# Patient Record
Sex: Female | Born: 1950 | Race: White | Hispanic: No | Smoking: Never smoker
Health system: Southern US, Community
[De-identification: ages and names within clinical notes are randomized; demographics above are authoritative.]

## PROBLEM LIST (undated history)

## (undated) DIAGNOSIS — Z8719 Personal history of other diseases of the digestive system: Secondary | ICD-10-CM

## (undated) DIAGNOSIS — Z8619 Personal history of other infectious and parasitic diseases: Secondary | ICD-10-CM

## (undated) DIAGNOSIS — K5190 Ulcerative colitis, unspecified, without complications: Secondary | ICD-10-CM

## (undated) DIAGNOSIS — K5790 Diverticulosis of intestine, part unspecified, without perforation or abscess without bleeding: Secondary | ICD-10-CM

## (undated) DIAGNOSIS — M1990 Unspecified osteoarthritis, unspecified site: Secondary | ICD-10-CM

## (undated) DIAGNOSIS — D369 Benign neoplasm, unspecified site: Secondary | ICD-10-CM

## (undated) HISTORY — DX: Personal history of other diseases of the digestive system: Z87.19

## (undated) HISTORY — PX: ESOPHAGOGASTRODUODENOSCOPY: SHX1529

## (undated) HISTORY — PX: BREAST EXCISIONAL BIOPSY: SUR124

## (undated) HISTORY — DX: Personal history of other infectious and parasitic diseases: Z86.19

## (undated) HISTORY — DX: Unspecified osteoarthritis, unspecified site: M19.90

## (undated) HISTORY — DX: Diverticulosis of intestine, part unspecified, without perforation or abscess without bleeding: K57.90

## (undated) HISTORY — DX: Ulcerative colitis, unspecified, without complications: K51.90

## (undated) HISTORY — DX: Benign neoplasm, unspecified site: D36.9

## (undated) HISTORY — PX: COLONOSCOPY: SHX174

---

## 1989-11-21 HISTORY — PX: CHOLECYSTECTOMY: SHX55

## 2013-11-21 DIAGNOSIS — D369 Benign neoplasm, unspecified site: Secondary | ICD-10-CM

## 2013-11-21 HISTORY — PX: BREAST BIOPSY: SHX20

## 2013-11-21 HISTORY — DX: Benign neoplasm, unspecified site: D36.9

## 2015-03-12 LAB — HEPATIC FUNCTION PANEL
ALT: 22 (ref 7–35)
AST: 23 (ref 13–35)
Alkaline Phosphatase: 59 (ref 25–125)
Bilirubin, Total: 1.1

## 2015-03-12 LAB — BASIC METABOLIC PANEL
BUN: 13 (ref 4–21)
Creatinine: 0.7 (ref 0.5–1.1)
Glucose: 86
Sodium: 143 (ref 137–147)

## 2015-03-12 LAB — CBC AND DIFFERENTIAL
HCT: 41 (ref 36–46)
Hemoglobin: 13.1 (ref 12.0–16.0)
Neutrophils Absolute: 2678
Platelets: 262 (ref 150–399)
WBC: 5.1

## 2015-03-12 LAB — VITAMIN D 25 HYDROXY (VIT D DEFICIENCY, FRACTURES): Vit D, 25-Hydroxy: 53

## 2015-03-12 LAB — LIPID PANEL
Cholesterol: 205 — AB (ref 0–200)
HDL: 65 (ref 35–70)
LDL Cholesterol: 124
LDl/HDL Ratio: 3.2
Triglycerides: 81 (ref 40–160)

## 2015-03-12 LAB — TSH: TSH: 0.99 (ref 0.41–5.90)

## 2015-03-12 LAB — HEMOGLOBIN A1C: Hemoglobin A1C: 5.4

## 2017-12-27 LAB — LIPID PANEL
CHOLESTEROL: 169 (ref 0–200)
HDL: 50 (ref 35–70)
LDL Cholesterol: 102
LDl/HDL Ratio: 3.4
Triglycerides: 86 (ref 40–160)

## 2017-12-27 LAB — BASIC METABOLIC PANEL: GLUCOSE: 109

## 2019-01-31 ENCOUNTER — Encounter: Payer: Self-pay | Admitting: Family Medicine

## 2019-01-31 ENCOUNTER — Encounter

## 2019-01-31 ENCOUNTER — Other Ambulatory Visit: Payer: Self-pay

## 2019-01-31 ENCOUNTER — Ambulatory Visit (INDEPENDENT_AMBULATORY_CARE_PROVIDER_SITE_OTHER): Payer: Medicare Other | Admitting: Family Medicine

## 2019-01-31 VITALS — BP 134/76 | HR 78 | Temp 98.1°F | Ht 63.5 in | Wt 162.2 lb

## 2019-01-31 DIAGNOSIS — Z1239 Encounter for other screening for malignant neoplasm of breast: Secondary | ICD-10-CM | POA: Diagnosis not present

## 2019-01-31 DIAGNOSIS — F419 Anxiety disorder, unspecified: Secondary | ICD-10-CM | POA: Diagnosis not present

## 2019-01-31 DIAGNOSIS — F329 Major depressive disorder, single episode, unspecified: Secondary | ICD-10-CM | POA: Diagnosis not present

## 2019-01-31 DIAGNOSIS — F32A Depression, unspecified: Secondary | ICD-10-CM

## 2019-01-31 DIAGNOSIS — Z86010 Personal history of colonic polyps: Secondary | ICD-10-CM | POA: Diagnosis not present

## 2019-01-31 NOTE — Progress Notes (Signed)
Subjective:   Patient ID: Crystal Blair, female    DOB: [birth year], 68 y.o.   MRN: [id number]  Crystal Blair is a pleasant 68 y.o. year old female who presents to clinic today with New Patient (Initial Visit) (Patient is here today to establish care.  She ae grees to get the flu shot.  She will schedule a CPE with PAP.  She will sign for her records.  She does C/O tension H/A in the back of her head.  Not currently on Rx's but takes OTC meds.  She is not currently fasting.  She agrees to go to [facility] for Mammogram if ordered.  She is requesting a therapist.  She will schedule with Dr. Raeford Razor for her left knee pain.  Is needing a referral for Colonoscopy has H/O tubular adenoma 30-years-ago.)  on 01/31/2019  HPI:  Here to establish care.   Of note, computer system was down while patient was here so we agreed to have her return for CPX/Pap but did go over all of her concerns and medical history during the OV today.  Moved her from [place] (via [place]) last year.  Anxiety/depression- chronic issue.  She worries about her adult children a lot.  Overall, feels happy but has always had a therapist to discuss her "worries" and would like to see one here.  She also has a history of tubular adenoma and is asking for a referral for a colonoscopy.  She is not currently taking an prescription  Medications.  She is due for CPX/PAP/mammogram as well.  She has had some knee pain in left knee.   No flowsheet data found.   No flowsheet data found.    No current outpatient medications on file prior to visit.   No current facility-administered medications on file prior to visit.     No Known Allergies  Past Medical History:  Diagnosis Date  . Arthritis   . History of chicken pox      No family history on file.  Social History   Socioeconomic History  . Marital status: Widowed    Spouse name: Not on file  . Number of children: Not on file  . Years of education: Not on file   . Highest education level: Not on file  Occupational History  . Not on file  Social Needs  . Financial resource strain: Not on file  . Food insecurity:    Worry: Not on file    Inability: Not on file  . Transportation needs:    Medical: Not on file    Non-medical: Not on file  Tobacco Use  . Smoking status: Never Smoker  . Smokeless tobacco: Never Used  Substance and Sexual Activity  . Alcohol use: Yes    Comment: ccas  . Drug use: Never  . Sexual activity: Yes  Lifestyle  . Physical activity:    Days per week: Not on file    Minutes per session: Not on file  . Stress: Not on file  Relationships  . Social connections:    Talks on phone: Not on file    Gets together: Not on file    Attends religious service: Not on file    Active member of club or organization: Not on file    Attends meetings of clubs or organizations: Not on file    Relationship status: Not on file  . Intimate partner violence:    Fear of current or ex partner: Not on file  Emotionally abused: Not on file    Physically abused: Not on file    Forced sexual activity: Not on file  Other Topics Concern  . Not on file  Social History Narrative  . Not on file   The PMH, PSH, Social History, Family History, Medications, and allergies have been reviewed in [facility], and have been updated if relevant.    Review of Systems  Constitutional: Negative.   HENT: Negative.   Respiratory: Negative.   Cardiovascular: Negative.   Gastrointestinal: Negative.   Endocrine: Negative.   Genitourinary: Negative.   Musculoskeletal: Positive for arthralgias.  Skin: Negative.   Allergic/Immunologic: Negative.   Neurological: Negative.   Hematological: Negative.   Psychiatric/Behavioral: Positive for sleep disturbance. Negative for confusion, decreased concentration, dysphoric mood, hallucinations, self-injury and suicidal ideas. The patient is nervous/anxious. The patient is not hyperactive.   All other systems  reviewed and are negative.      Objective:    BP 134/76 (BP Location: Left Arm, Patient Position: Sitting, Cuff Size: Normal)   Pulse 78   Temp 98.1 F (36.7 C) (Oral)   Ht 5' 3.5" (1.613 m)   Wt 162 lb 3.2 oz (73.6 kg)   SpO2 98%   BMI 28.28 kg/m    Physical Exam    General:  Well-developed,well-nourished,in no acute distress; alert,appropriate and cooperative throughout examination Head:  normocephalic and atraumatic.   Eyes:  vision grossly intact, PERRL Ears:  R ear normal and L ear normal externally, TMs clear bilaterally Nose:  no external deformity.   Mouth:  good dentition.   Neck:  No deformities, masses, or tenderness noted. Lungs:  Normal respiratory effort, chest expands symmetrically. Lungs are clear to auscultation, no crackles or wheezes. Heart:  Normal rate and regular rhythm. S1 and S2 normal without gallop, murmur, click, rub or other extra sounds. Abdomen:  Bowel sounds positive,abdomen soft and non-tender without masses, organomegaly or hernias noted. Msk:  No deformity or scoliosis noted of thoracic or lumbar spine.   Extremities:  No clubbing, cyanosis, edema, or deformity noted with normal full range of motion of all joints.   Neurologic:  alert & oriented X3 and gait normal.   Skin:  Intact without suspicious lesions or rashes Cervical Nodes:  No lymphadenopathy noted Axillary Nodes:  No palpable lymphadenopathy Psych:  Cognition and judgment appear intact. Alert and cooperative with normal attention span and concentration. No apparent delusions, illusions, hallucinations      Assessment & Plan:   Screening for breast cancer - Plan: MM Digital Screening  History of adenomatous polyp of colon - Plan: Ambulatory referral to Gastroenterology  Anxiety and depression - Plan: Ambulatory referral to Psychology No follow-ups on file.

## 2019-01-31 NOTE — Progress Notes (Signed)
[facility]/thx dmf

## 2019-01-31 NOTE — Patient Instructions (Addendum)
1.) Please schedule an appointment with Dr. Raeford Razor for your left knee pain.  2.) Schedule a Physical with PAP and please be fasting that morning.  You may drink water and black coffee only that morning :)  3.) Please call [place] at [phone] both Burnetta Sabin and Atlee Abide are wonderful  4.) Please sign request for records from [facility] at [place], [place], [place] [address] - Dr. Chari Manning.

## 2019-01-31 NOTE — Progress Notes (Signed)
[facility]/thx dmf

## 2019-01-31 NOTE — Assessment & Plan Note (Signed)
>  20 minutes spent in face to face time with patient, >50% spent in counselling or coordination of care discussing medical history and concerns.  GAD 7 and PHq 9 screenings done today and were reassuring.  Referral placed for psychotherapy.  Will also refer to GI and place order for mammogram once computer system back up. She will return for labs, CPX, and pap smear. The patient indicates understanding of these issues and agrees with the plan.

## 2019-07-23 ENCOUNTER — Telehealth: Payer: Self-pay

## 2019-07-23 ENCOUNTER — Ambulatory Visit
Admission: RE | Admit: 2019-07-23 | Discharge: 2019-07-23 | Disposition: A | Discharge status: Home / Self Care | Payer: Medicare Other | Source: Ambulatory Visit | Attending: Family Medicine | Admitting: Family Medicine

## 2019-07-23 ENCOUNTER — Other Ambulatory Visit: Payer: Self-pay

## 2019-07-23 DIAGNOSIS — Z1239 Encounter for other screening for malignant neoplasm of breast: Secondary | ICD-10-CM

## 2019-07-23 DIAGNOSIS — Z1231 Encounter for screening mammogram for malignant neoplasm of breast: Secondary | ICD-10-CM | POA: Diagnosis not present

## 2019-07-23 NOTE — Telephone Encounter (Signed)
Copied from [place] [id number]. Topic: General - Inquiry >> Jul 23, 2019  2:19 PM Reyne Dumas L wrote: Reason for CRM:  Pt wants to know if she will have a PAP done at her CPE.

## 2019-07-24 NOTE — Telephone Encounter (Signed)
TA-does this patient need a PAP at her CPE? She is 13 has UHC/Medicare/plz advise/thx dmf

## 2019-07-25 NOTE — Telephone Encounter (Signed)
Yes if this is a welcome to medicare.

## 2019-07-26 NOTE — Telephone Encounter (Signed)
LMOVM that yes we will do a PAP and to please be fasting but drink plenty of water for 24 hours and when wakes drink water and/or black coffee only/thx dmf

## 2019-07-31 ENCOUNTER — Ambulatory Visit (INDEPENDENT_AMBULATORY_CARE_PROVIDER_SITE_OTHER): Payer: Medicare Other | Admitting: Behavioral Health

## 2019-07-31 ENCOUNTER — Other Ambulatory Visit: Payer: Self-pay

## 2019-07-31 ENCOUNTER — Encounter: Payer: Self-pay | Admitting: Family Medicine

## 2019-07-31 DIAGNOSIS — Z23 Encounter for immunization: Secondary | ICD-10-CM

## 2019-07-31 NOTE — Progress Notes (Signed)
Patient presents in office today for Influenza vaccination. IM injection was given in the left deltoid. Patient tolerated the injection well. No signs or symptoms of a reaction were noted prior to patient leaving the nurse visit. 

## 2019-08-01 ENCOUNTER — Ambulatory Visit: Payer: Medicare Other | Admitting: Family Medicine

## 2019-08-01 ENCOUNTER — Encounter: Payer: Medicare Other | Admitting: Family Medicine

## 2019-09-09 ENCOUNTER — Encounter: Payer: Self-pay | Admitting: Internal Medicine

## 2019-09-10 DIAGNOSIS — Z0000 Encounter for general adult medical examination without abnormal findings: Secondary | ICD-10-CM | POA: Insufficient documentation

## 2019-09-10 DIAGNOSIS — Z01419 Encounter for gynecological examination (general) (routine) without abnormal findings: Secondary | ICD-10-CM | POA: Insufficient documentation

## 2019-09-10 NOTE — Progress Notes (Signed)
Subjective:   Patient ID: Crystal Blair, female    DOB: 1951/08/16, 68 y.o.   MRN: [id number]  Crystal Blair is a pleasant 68 y.o. year old female who presents to clinic today with Annual Exam (would like pap today.  Pt will recieve Prevnar  injection today.  t would like another referral to GI,  pt also is fasting today.)  on 09/11/2019  HPI:  Health Maintenance  Topic Date Due  . Hepatitis C Screening  June 16, 1951  . HIV Screening  11/04/1968  . COLONOSCOPY  11/05/2003  . DEXA SCAN  11/04/2018  . TETANUS/TDAP  09/17/2019 (Originally 11/04/1972)  . PNA vac Low Risk Adult (2 of 2 - PPSV23) 09/10/2020  . MAMMOGRAM  07/22/2021  . PAP SMEAR-Modifier  09/10/2022  . INFLUENZA VACCINE  Completed   Due for pap smear.  No post menopausal bleeding.  Established care with me on 01/31/19- note reviewed.  She moved from [place] ([employer] last year.  She also has a history of tubular adenoma and I thereofre referred her for colonoscopy in 01/2019 but I do not see those results. I also referred her for a mammogram- done on 07/23/19- normal.  ?colonoscopy cancelled because of Covid?  She needs a new referral.  Has not had a dexa scan in years.   Anxiety/depression- chronic issue.  She worries about her adult children a lot.  Overall, feels happy but has always had a therapist to discuss her "worries" and would like to see one here. I therefore referred her to Dr. Gwenlyn Saran.  Depression screen [facility] 2/9 09/11/2019 01/31/2019  Decreased Interest 0 0  Down, Depressed, Hopeless 0 1  PHQ - 2 Score 0 1  Altered sleeping 0 1  Tired, decreased energy 0 1  Change in appetite 0 0  Feeling bad or failure about yourself  0 0  Trouble concentrating 0 0  Moving slowly or fidgety/restless 0 0  Suicidal thoughts 0 0  PHQ-9 Score 0 3  Difficult doing work/chores Not difficult at all Somewhat difficult   GAD 7 : Generalized Anxiety Score 09/11/2019 01/31/2019  Nervous, Anxious, on Edge 1 1   Control/stop worrying 0 2  Worry too much - different things 0 2  Trouble relaxing 0 1  Restless 0 0  Easily annoyed or irritable 0 1  Afraid - awful might happen 0 0  Total GAD 7 Score 1 7  Anxiety Difficulty Not difficult at all Somewhat difficult     Lab Results  Component Value Date   CHOL 169 12/27/2017   HDL 50 12/27/2017   LDLCALC 102 12/27/2017   TRIG 86 12/27/2017   Lab Results  Component Value Date   WBC 5.1 03/12/2015   HGB 13.1 03/12/2015   HCT 41 03/12/2015   PLT 262 03/12/2015   Lab Results  Component Value Date   TSH 0.99 03/12/2015   Current Outpatient Medications on File Prior to Visit  Medication Sig Dispense Refill  . Ascorbic Acid (VITAMIN C ADULT GUMMIES PO) Take by mouth.    Marland Kitchen BIOTIN PO Take 3,000 mcg by mouth daily.    . Calcium Carbonate (CALTRATE 600 PO) Take by mouth.    . Multiple Vitamin (MULTIVITAMIN) tablet Take 1 tablet by mouth daily.     No current facility-administered medications on file prior to visit.     No Known Allergies  Past Medical History:  Diagnosis Date  . Arthritis   . History of chicken pox   .  Tubular adenoma 2015   Per Colonoscopy/q34yrs    Past Surgical History:  Procedure Laterality Date  . BREAST BIOPSY Left 2015  . BREAST EXCISIONAL BIOPSY Left   . CHOLECYSTECTOMY  1991    Family History  Problem Relation Age of Onset  . Arthritis Mother   . COPD Mother   . Depression Mother   . Heart attack Mother   . Alcohol abuse Father   . Early death Father   . Hyperlipidemia Father   . Hypertension Father   . Cancer Sister   . Hypertension Sister     Social History   Socioeconomic History  . Marital status: Widowed    Spouse name: Not on file  . Number of children: Not on file  . Years of education: Not on file  . Highest education level: Not on file  Occupational History  . Not on file  Social Needs  . Financial resource strain: Not on file  . Food insecurity    Worry: Not on file     Inability: Not on file  . Transportation needs    Medical: Not on file    Non-medical: Not on file  Tobacco Use  . Smoking status: Never Smoker  . Smokeless tobacco: Never Used  Substance and Sexual Activity  . Alcohol use: Yes    Comment: ccas  . Drug use: Never  . Sexual activity: Yes  Lifestyle  . Physical activity    Days per week: Not on file    Minutes per session: Not on file  . Stress: Not on file  Relationships  . Social Herbalist on phone: Not on file    Gets together: Not on file    Attends religious service: Not on file    Active member of club or organization: Not on file    Attends meetings of clubs or organizations: Not on file    Relationship status: Not on file  . Intimate partner violence    Fear of current or ex partner: Not on file    Emotionally abused: Not on file    Physically abused: Not on file    Forced sexual activity: Not on file  Other Topics Concern  . Not on file  Social History Narrative  . Not on file   The PMH, PSH, Social History, Family History, Medications, and allergies have been reviewed in [facility], and have been updated if relevant.    Review of Systems  Constitutional: Negative.   HENT: Negative.   Eyes: Negative.   Respiratory: Negative.   Cardiovascular: Negative.   Endocrine: Negative.   Genitourinary: Negative.   Musculoskeletal: Negative.   Skin: Negative.   Allergic/Immunologic: Negative.   Neurological: Negative.   Hematological: Negative.   Psychiatric/Behavioral: Negative.   All other systems reviewed and are negative.      Objective:    BP 120/82 (BP Location: Left Arm, Patient Position: Sitting, Cuff Size: Normal)   Pulse 77   Temp 98.9 F (37.2 C) (Oral)   Ht 5\' 4"  (1.626 m)   Wt 159 lb 6.4 oz (72.3 kg)   SpO2 98%   BMI 27.36 kg/m  Wt Readings from Last 3 Encounters:  09/11/19 159 lb 6.4 oz (72.3 kg)  01/31/19 162 lb 3.2 oz (73.6 kg)     Physical Exam   General:   Well-developed,well-nourished,in no acute distress; alert,appropriate and cooperative throughout examination Head:  normocephalic and atraumatic.   Eyes:  vision grossly intact, PERRL Ears:  R ear normal and L ear normal externally, TMs clear bilaterally Nose:  no external deformity.   Mouth:  good dentition.   Neck:  No deformities, masses, or tenderness noted. Breasts:  No mass, nodules, thickening, tenderness, bulging, retraction, inflamation, nipple discharge or skin changes noted.   Lungs:  Normal respiratory effort, chest expands symmetrically. Lungs are clear to auscultation, no crackles or wheezes. Heart:  Normal rate and regular rhythm. S1 and S2 normal without gallop, murmur, click, rub or other extra sounds. Abdomen:  Bowel sounds positive,abdomen soft and non-tender without masses, organomegaly or hernias noted. Rectal:  no external abnormalities.   Genitalia:  Pelvic Exam:        External: normal female genitalia without lesions or masses        Vagina: normal without lesions or masses        Cervix: normal without lesions or masses        Adnexa: normal bimanual exam without masses or fullness        Uterus: normal by palpation        Pap smear: performed Msk:  No deformity or scoliosis noted of thoracic or lumbar spine.   Extremities:  No clubbing, cyanosis, edema, or deformity noted with normal full range of motion of all joints.   Neurologic:  alert & oriented X3 and gait normal.   Skin:  Intact without suspicious lesions or rashes Cervical Nodes:  No lymphadenopathy noted Axillary Nodes:  No palpable lymphadenopathy Psych:  Cognition and judgment appear intact. Alert and cooperative with normal attention span and concentration. No apparent delusions, illusions, hallucinations       Assessment & Plan:   Well woman exam with routine gynecological exam  History of adenomatous polyp of colon - Plan: CBC with Differential/Platelet  Anxiety and depression  Need for  hepatitis C screening test - Plan: Hepatitis C Antibody  Screening for HIV without presence of risk factors - Plan: HIV antibody (with reflex)  Disorder of bone and cartilage - Plan: Comprehensive metabolic panel, Vitamin D (25 hydroxy)  Hyperlipidemia, unspecified hyperlipidemia type - Plan: Comprehensive metabolic panel, TSH, Lipid panel No follow-ups on file.

## 2019-09-11 ENCOUNTER — Other Ambulatory Visit: Payer: Self-pay

## 2019-09-11 ENCOUNTER — Other Ambulatory Visit (HOSPITAL_COMMUNITY)
Admission: RE | Admit: 2019-09-11 | Discharge: 2019-09-11 | Disposition: A | Discharge status: Home / Self Care | Payer: Medicare Other | Source: Ambulatory Visit | Attending: Family Medicine | Admitting: Family Medicine

## 2019-09-11 ENCOUNTER — Ambulatory Visit (INDEPENDENT_AMBULATORY_CARE_PROVIDER_SITE_OTHER): Payer: Medicare Other | Admitting: Family Medicine

## 2019-09-11 ENCOUNTER — Encounter: Payer: Self-pay | Admitting: Family Medicine

## 2019-09-11 VITALS — BP 120/82 | HR 77 | Temp 98.9°F | Ht 64.0 in | Wt 159.4 lb

## 2019-09-11 DIAGNOSIS — M899 Disorder of bone, unspecified: Secondary | ICD-10-CM | POA: Diagnosis not present

## 2019-09-11 DIAGNOSIS — Z124 Encounter for screening for malignant neoplasm of cervix: Secondary | ICD-10-CM

## 2019-09-11 DIAGNOSIS — M949 Disorder of cartilage, unspecified: Secondary | ICD-10-CM | POA: Diagnosis not present

## 2019-09-11 DIAGNOSIS — Z01419 Encounter for gynecological examination (general) (routine) without abnormal findings: Secondary | ICD-10-CM

## 2019-09-11 DIAGNOSIS — E2839 Other primary ovarian failure: Secondary | ICD-10-CM

## 2019-09-11 DIAGNOSIS — Z23 Encounter for immunization: Secondary | ICD-10-CM

## 2019-09-11 DIAGNOSIS — Z1159 Encounter for screening for other viral diseases: Secondary | ICD-10-CM | POA: Diagnosis not present

## 2019-09-11 DIAGNOSIS — Z1151 Encounter for screening for human papillomavirus (HPV): Secondary | ICD-10-CM | POA: Diagnosis not present

## 2019-09-11 DIAGNOSIS — F419 Anxiety disorder, unspecified: Secondary | ICD-10-CM

## 2019-09-11 DIAGNOSIS — K635 Polyp of colon: Secondary | ICD-10-CM

## 2019-09-11 DIAGNOSIS — Z114 Encounter for screening for human immunodeficiency virus [HIV]: Secondary | ICD-10-CM | POA: Diagnosis not present

## 2019-09-11 DIAGNOSIS — E785 Hyperlipidemia, unspecified: Secondary | ICD-10-CM

## 2019-09-11 DIAGNOSIS — R300 Dysuria: Secondary | ICD-10-CM

## 2019-09-11 DIAGNOSIS — Z86010 Personal history of colonic polyps: Secondary | ICD-10-CM

## 2019-09-11 DIAGNOSIS — F329 Major depressive disorder, single episode, unspecified: Secondary | ICD-10-CM

## 2019-09-11 DIAGNOSIS — F32A Depression, unspecified: Secondary | ICD-10-CM

## 2019-09-11 LAB — VITAMIN D 25 HYDROXY (VIT D DEFICIENCY, FRACTURES): VITD: 40.95 ng/mL (ref 30.00–100.00)

## 2019-09-11 LAB — URINALYSIS, ROUTINE W REFLEX MICROSCOPIC
Bilirubin Urine: NEGATIVE
Hgb urine dipstick: NEGATIVE
Ketones, ur: NEGATIVE
Leukocytes,Ua: NEGATIVE
Nitrite: NEGATIVE
Specific Gravity, Urine: 1.01 (ref 1.000–1.030)
Total Protein, Urine: NEGATIVE
Urine Glucose: NEGATIVE
Urobilinogen, UA: 0.2 (ref 0.0–1.0)
pH: 5 (ref 5.0–8.0)

## 2019-09-11 LAB — CBC WITH DIFFERENTIAL/PLATELET
Basophils Absolute: 0.1 10*3/uL (ref 0.0–0.1)
Basophils Relative: 0.9 % (ref 0.0–3.0)
Eosinophils Absolute: 0.2 10*3/uL (ref 0.0–0.7)
Eosinophils Relative: 2.9 % (ref 0.0–5.0)
HCT: 41.3 % (ref 36.0–46.0)
Hemoglobin: 14 g/dL (ref 12.0–15.0)
Lymphocytes Relative: 37.5 % (ref 12.0–46.0)
Lymphs Abs: 2.1 10*3/uL (ref 0.7–4.0)
MCHC: 34 g/dL (ref 30.0–36.0)
MCV: 92.9 fl (ref 78.0–100.0)
Monocytes Absolute: 0.4 10*3/uL (ref 0.1–1.0)
Monocytes Relative: 7.1 % (ref 3.0–12.0)
Neutro Abs: 2.9 10*3/uL (ref 1.4–7.7)
Neutrophils Relative %: 51.6 % (ref 43.0–77.0)
Platelets: 263 10*3/uL (ref 150.0–400.0)
RBC: 4.45 Mil/uL (ref 3.87–5.11)
RDW: 12.8 % (ref 11.5–15.5)
WBC: 5.6 10*3/uL (ref 4.0–10.5)

## 2019-09-11 LAB — COMPREHENSIVE METABOLIC PANEL
ALT: 24 U/L (ref 0–35)
AST: 21 U/L (ref 0–37)
Albumin: 4.3 g/dL (ref 3.5–5.2)
Alkaline Phosphatase: 55 U/L (ref 39–117)
BUN: 14 mg/dL (ref 6–23)
CO2: 30 mEq/L (ref 19–32)
Calcium: 9.6 mg/dL (ref 8.4–10.5)
Chloride: 102 mEq/L (ref 96–112)
Creatinine, Ser: 0.65 mg/dL (ref 0.40–1.20)
GFR: 91.24 mL/min (ref >60.00)
Glucose, Bld: 102 mg/dL — ABNORMAL HIGH (ref 70–99)
Potassium: 4.3 mEq/L (ref 3.5–5.1)
Sodium: 138 mEq/L (ref 135–145)
Total Bilirubin: 1.2 mg/dL (ref 0.2–1.2)
Total Protein: 6.9 g/dL (ref 6.0–8.3)

## 2019-09-11 LAB — LIPID PANEL
Cholesterol: 215 mg/dL — ABNORMAL HIGH (ref 0–200)
HDL: 57.7 mg/dL (ref >39.00)
LDL Cholesterol: 125 mg/dL — ABNORMAL HIGH (ref 0–99)
NonHDL: 157.64
Total CHOL/HDL Ratio: 4
Triglycerides: 164 mg/dL — ABNORMAL HIGH (ref 0.0–149.0)
VLDL: 32.8 mg/dL (ref 0.0–40.0)

## 2019-09-11 LAB — TSH: TSH: 0.92 u[IU]/mL (ref 0.35–4.50)

## 2019-09-11 NOTE — Assessment & Plan Note (Addendum)
Reviewed preventive care protocols, scheduled due services, and updated immunizations Discussed nutrition, exercise, diet, and healthy lifestyle.  Pap smear performed.  Prevnar 13 today.  Also had her flu shot.  Referred to GI again for screening colonoscopy)h/o polyps so not a candidate for cologuard.  DEXA ordered- Bone density ordered and phone number for breast center given to pt to schedule her own mammogram.  Orders Placed This Encounter  Procedures  . DG Bone Density  . Pneumococcal conjugate vaccine 13-valent  . Hepatitis C Antibody  . HIV antibody (with reflex)  . CBC with Differential/Platelet  . Comprehensive metabolic panel  . TSH  . Lipid panel  . Vitamin D (25 hydroxy)  . Ambulatory referral to Gastroenterology

## 2019-09-11 NOTE — Assessment & Plan Note (Signed)
She is feeling better but she would like to try to call Dr. Gwenlyn Saran. Screening improved today. GAD 7 : Generalized Anxiety Score 09/11/2019 01/31/2019  Nervous, Anxious, on Edge 1 1  Control/stop worrying 0 2  Worry too much - different things 0 2  Trouble relaxing 0 1  Restless 0 0  Easily annoyed or irritable 0 1  Afraid - awful might happen 0 0  Total GAD 7 Score 1 7  Anxiety Difficulty Not difficult at all Somewhat difficult    PHQ9 SCORE ONLY 09/11/2019 01/31/2019  Score 0 3

## 2019-09-11 NOTE — Addendum Note (Signed)
Addended by: Darral Dash on: 09/11/2019 10:19 AM   Modules accepted: Orders

## 2019-09-11 NOTE — Patient Instructions (Addendum)
Great to see you! I will call you with your lab results from today and you can view them online.   Please call the breast center at [phone] to schedule your bone density.   Please call [place] at [phone]- please ask to see Dr. Zella Ball (tell her I sent you).  My favorite dermatology group is [facility]- epsecially the female physicians like Dr. Derrel Nip ;) [address] [place], [place] [address], [place]  Phone: [phone]  I like Dr. Johnn Hai is a great dentist but she is located in [place] (not sure if that's too far).

## 2019-09-12 LAB — CYTOLOGY - PAP
Comment: NEGATIVE
Diagnosis: NEGATIVE
High risk HPV: NEGATIVE

## 2019-09-12 LAB — HEPATITIS C ANTIBODY
Hepatitis C Ab: NONREACTIVE
SIGNAL TO CUT-OFF: 0.01 (ref <1.00)

## 2019-09-12 LAB — HIV ANTIBODY (ROUTINE TESTING W REFLEX): HIV 1&2 Ab, 4th Generation: NONREACTIVE

## 2019-10-08 ENCOUNTER — Telehealth: Payer: Self-pay

## 2019-10-08 NOTE — Telephone Encounter (Signed)
Copied from [place] [id number]. Topic: General - Other >> Oct 08, 2019 12:16 PM Leward Quan A wrote: Reason for CRM: Patient called to inquire if recent GI reports were received from Dr Haynes Kerns or Dr Doreene Eland office regarding her recent colonoscopy. Please advise patient can be reached at Ph# [phone]

## 2019-10-10 NOTE — Telephone Encounter (Signed)
I called pt and informed her that we have not yet received reports regarding her recent colonoscopy as of yet.  Pt verbalized understanding.

## 2019-11-18 ENCOUNTER — Ambulatory Visit
Admission: RE | Admit: 2019-11-18 | Discharge: 2019-11-18 | Disposition: A | Discharge status: Home / Self Care | Payer: Medicare Other | Source: Ambulatory Visit | Attending: Family Medicine | Admitting: Family Medicine

## 2019-11-18 ENCOUNTER — Other Ambulatory Visit: Payer: Self-pay

## 2019-11-18 DIAGNOSIS — E2839 Other primary ovarian failure: Secondary | ICD-10-CM

## 2019-11-18 DIAGNOSIS — Z780 Asymptomatic menopausal state: Secondary | ICD-10-CM | POA: Diagnosis not present

## 2019-11-18 DIAGNOSIS — M85852 Other specified disorders of bone density and structure, left thigh: Secondary | ICD-10-CM | POA: Diagnosis not present

## 2019-11-19 ENCOUNTER — Encounter: Payer: Self-pay | Admitting: Family Medicine

## 2020-01-10 ENCOUNTER — Ambulatory Visit: Payer: Medicare Other

## 2020-01-12 ENCOUNTER — Ambulatory Visit: Payer: Medicare Other | Attending: Internal Medicine

## 2020-01-12 DIAGNOSIS — Z23 Encounter for immunization: Secondary | ICD-10-CM | POA: Insufficient documentation

## 2020-01-12 NOTE — Progress Notes (Signed)
   Covid-19 Vaccination Clinic  Name:  Karenlee Fenton    MRN: [id number] DOB: 1951-07-06  01/12/2020  Ms. Mcnaught was observed post Covid-19 immunization for 15 minutes without incidence. She was provided with Vaccine Information Sheet and instruction to access the V-Safe system.   Ms. Steve was instructed to call 911 with any severe reactions post vaccine: Marland Kitchen Difficulty breathing  . Swelling of your face and throat  . A fast heartbeat  . A bad rash all over your body  . Dizziness and weakness    Immunizations Administered    Name Date Dose VIS Date Route   Pfizer COVID-19 Vaccine 01/12/2020  9:10 AM 0.3 mL 11/01/2019 Intramuscular   Manufacturer: [place]   Lot: [id number]   [place]: [id number]

## 2020-02-05 ENCOUNTER — Ambulatory Visit: Payer: Medicare Other | Attending: Internal Medicine

## 2020-02-05 DIAGNOSIS — Z23 Encounter for immunization: Secondary | ICD-10-CM

## 2020-02-05 NOTE — Progress Notes (Signed)
   Covid-19 Vaccination Clinic  Name:  Crystal Blair    MRN: [id number] DOB: December 06, 1950  02/05/2020  [name] was observed post Covid-19 immunization for 15 minutes without incident. She was provided with Vaccine Information Sheet and instruction to access the V-Safe system.   [name] was instructed to call 911 with any severe reactions post vaccine: Marland Kitchen Difficulty breathing  . Swelling of face and throat  . A fast heartbeat  . A bad rash all over body  . Dizziness and weakness   Immunizations Administered    Name Date Dose VIS Date Route   Pfizer COVID-19 Vaccine 02/05/2020  8:40 AM 0.3 mL 11/01/2019 Intramuscular   Manufacturer: [place]   Lot: [id number]   [place]: [id number]

## 2020-04-25 DIAGNOSIS — M25532 Pain in left wrist: Secondary | ICD-10-CM | POA: Diagnosis not present

## 2020-04-25 DIAGNOSIS — S299XXA Unspecified injury of thorax, initial encounter: Secondary | ICD-10-CM | POA: Diagnosis not present

## 2020-04-25 DIAGNOSIS — Y92410 Unspecified street and highway as the place of occurrence of the external cause: Secondary | ICD-10-CM | POA: Diagnosis not present

## 2020-04-25 DIAGNOSIS — S8002XA Contusion of left knee, initial encounter: Secondary | ICD-10-CM | POA: Diagnosis not present

## 2020-04-25 DIAGNOSIS — S6992XA Unspecified injury of left wrist, hand and finger(s), initial encounter: Secondary | ICD-10-CM | POA: Diagnosis not present

## 2020-04-25 DIAGNOSIS — S8992XA Unspecified injury of left lower leg, initial encounter: Secondary | ICD-10-CM | POA: Diagnosis not present

## 2020-04-25 DIAGNOSIS — S022XXA Fracture of nasal bones, initial encounter for closed fracture: Secondary | ICD-10-CM | POA: Diagnosis not present

## 2020-04-25 DIAGNOSIS — R519 Headache, unspecified: Secondary | ICD-10-CM | POA: Diagnosis not present

## 2020-04-25 DIAGNOSIS — S20219A Contusion of unspecified front wall of thorax, initial encounter: Secondary | ICD-10-CM | POA: Diagnosis not present

## 2020-04-25 DIAGNOSIS — S0993XA Unspecified injury of face, initial encounter: Secondary | ICD-10-CM | POA: Diagnosis not present

## 2020-04-25 DIAGNOSIS — M25462 Effusion, left knee: Secondary | ICD-10-CM | POA: Diagnosis not present

## 2020-04-25 DIAGNOSIS — S0990XA Unspecified injury of head, initial encounter: Secondary | ICD-10-CM | POA: Diagnosis not present

## 2020-04-25 DIAGNOSIS — R0602 Shortness of breath: Secondary | ICD-10-CM | POA: Diagnosis not present

## 2020-04-25 DIAGNOSIS — M7989 Other specified soft tissue disorders: Secondary | ICD-10-CM | POA: Diagnosis not present

## 2020-04-25 DIAGNOSIS — R0789 Other chest pain: Secondary | ICD-10-CM | POA: Diagnosis not present

## 2020-04-25 NOTE — ED Notes (Signed)
ED Patient Summary       ;       [facility] Emergency Department  [address], [place], [place] [address]  [phone]  Discharge Instructions (Patient)  _______________________________________     Name: Haley Pace, Haley Pace  DOB:  01/09/1951                   MRN: [id number]                   FIN: [id number]  Reason For Visit: Rib/trunk pain-swelling; Fall; BRUISES FROM FALL ON SIDEWALK  Final Diagnosis: Chest wall contusion; Fall; Knee contusion; Nasal fracture; Wrist pain     Visit Date: 04/25/2020 14:52:00  Address: [address] [place] [address]  Phone: [phone]     Emergency Department Providers:         Primary Physician:   Serina Cowper         [facility] would like to thank you for allowing [place] to assist you with your healthcare needs. The following includes patient education materials and information regarding your injury/illness.     Follow-up Instructions:  You were seen today on an emergency basis. Please contact your primary care doctor for a follow up appointment. If you received a referral to a specialist doctor, it is important you follow-up as instructed.    It is important that you call your follow-up doctor to schedule and confirm the location of your next appointment. Your doctor may practice at multiple locations. The office location of your follow-up appointment may be different to the one written on your discharge instructions.    If you do not have a primary care doctor, please call [phone] for help in finding a [facility] Provider. For help in finding a specialist doctor, please call [phone].    If your condition gets worse before your follow-up with your primary care doctor or specialist, please return to the Emergency Department.      Coronavirus 2019 (COVID-19) Reminders:     Patients age 13 - 65, with parental consent, and patients over age 89 can make an appointment for a COVID-19 vaccine. Patients can contact their [facility] doctors' offices to schedule an appointment to receive the COVID-19 vaccine. Patients who do not have a [facility] physician can call [phone] to schedule vaccination appointments.      Follow Up Appointments:  Primary Care Provider:      Name: PCP,  UNKNOWN      Phone:                  With: Address: When:   Follow up with primary care provider  In 3 days              Printed Prescriptions:    Patient Education Materials:  Discharge Orders          Discharge Patient 04/25/20 17:24:00 EDT         Comment:      Joint Pain; Musculoskeletal Pain; Nasal Fracture; Chest Contusion, Easy-to-Read     Joint Pain    Joint pain, which is also called arthralgia, can be caused by many things. Joint pain often goes away when you follow your health care provider's instructions for relieving pain at home. However, joint pain can also be caused by conditions that require further treatment. Common causes of joint pain include:     Bruising in the  area of the joint.     Overuse of the joint.     Wear and tear on the joints that occur with aging (osteoarthritis).     Various other forms of arthritis.     A buildup of a crystal form of uric acid in the joint (gout).     Infections of the joint (septic arthritis) or of the bone (osteomyelitis).    Your health care provider may recommend medicine to help with the pain. If your joint pain continues, additional tests may be needed to diagnose your condition.    HOME CARE INSTRUCTIONS    Watch your condition for any changes. Follow these instructions as directed to lessen the pain that you are feeling.     Take medicines only as directed by your health care provider.     Rest the affected area for as long as your health care provider says that you should. If directed to do so, raise the painful joint above the level of your heart while you are sitting or lying down.     Do not do things that cause or worsen pain.     If directed, apply ice to the  painful area:    ? Put ice in a plastic bag.    ? Place a towel between your skin and the bag.    ? Leave the ice on for 20 minutes, 2?3 times per day.     Wear an elastic bandage, splint, or sling as directed by your health care provider. Loosen the elastic bandage or splint if your fingers or toes become numb and tingle, or if they turn cold and blue.     Begin exercising or stretching the affected area as directed by your health care provider. Ask your health care provider what types of exercise are safe for you.     Keep all follow-up visits as directed by your health care provider. This is important.    SEEK MEDICAL CARE IF:     Your pain increases, and medicine does not help.     Your joint pain does not improve within 3 days.     You have increased bruising or swelling.     You have a fever.     You lose 10 lb (4.5 kg) or more without trying.    SEEK IMMEDIATE MEDICAL CARE IF:     You are not able to move the joint.      Your fingers or toes become numb or they turn cold and blue.    This information is not intended to replace advice given to you by your health care provider. Make sure you discuss any questions you have with your health care provider.    Document Released: 11/07/2005 Document Revised: 11/28/2014 Document Reviewed: 08/19/2014  Elsevier Interactive Patient Education ?2016 [employer] Inc.       Musculoskeletal Pain    Musculoskeletal pain is muscle and boney aches and pains. These pains can occur in any part of the body. Your caregiver may treat you without knowing the cause of the pain. They may treat you if blood or urine tests, X-rays, and other tests were normal.     CAUSES    There is often not a definite cause or reason for these pains. These pains may be caused by a type of germ (virus). The discomfort may also come from overuse. Overuse includes working out too hard when your body is not fit. Boney aches also come from weather  changes. Bone is sensitive to atmospheric pressure changes.     HOME CARE INSTRUCTIONS     Ask when your test results will be ready. Make sure you get your test results.     Only take over-the-counter or prescription medicines for pain, discomfort, or fever as directed by your caregiver. If you were given medications for your condition, do not drive, operate machinery or power tools, or sign legal documents for 24 hours. Do not drink alcohol. Do not take sleeping pills or other medications that may interfere with treatment.     Continue all activities unless the activities cause more pain. When the pain lessens, slowly resume normal activities. Gradually increase the intensity and duration of the activities or exercise.      During periods of severe pain, bed rest may be helpful. Lay or sit in any position that is comfortable.      Putting ice on the injured area.    ? Put ice in a bag.     ? Place a towel between your skin and the bag.    ? Leave the ice on for 15 to 20 minutes, 3 to 4 times a day.      Follow up with your caregiver for continued problems and no reason can be found for the pain. If the pain becomes worse or does not go away, it may be necessary to repeat tests or do additional testing. Your caregiver may need to look further for a possible cause.    SEEK IMMEDIATE MEDICAL CARE IF:     You have pain that is getting worse and is not relieved by medications.     You develop chest pain that is associated with shortness or breath, sweating, feeling sick to your stomach (nauseous), or throw up (vomit).     Your pain becomes localized to the abdomen.     You develop any new symptoms that seem different or that concern you.    MAKE SURE YOU:     Understand these instructions.      Will watch your condition.     Will get help right away if you are not doing well or get worse.    This information is not intended to replace advice given to you by your health care provider. Make sure you discuss any questions you have with your health care provider.    Document Released:  11/07/2005 Document Revised: 01/30/2012 Document Reviewed: 07/12/2013  Elsevier Interactive Patient Education ?2016 Elsevier Inc.       Nasal Fracture    A nasal fracture is a break or crack in the bones or cartilage of the nose. Minor breaks do not require treatment. These breaks usually heal on their own after about one month. Serious breaks may require surgery.      CAUSES    This injury is usually caused by a blunt injury to the nose. This type of injury often occurs from:     Contact sports.     Car accidents.     Falls.     Getting punched.    SYMPTOMS    Symptoms of this injury include:     Pain.     Swelling of the nose.     Bleeding from the nose.     Bruising around the nose or eyes. This may include having black eyes.     Crooked appearance of the nose.    DIAGNOSIS    This injury may be diagnosed with a  physical exam. The health care provider will gently feel the nose for signs of broken bones. He or she will look inside the nostrils to make sure that there is not a blood-filled swelling on the dividing wall between the nostrils (septal hematoma). X-rays of the nose may not show a nasal fracture even when one is present. In some cases, X-rays or a CT scan may be done 1?5 days after the injury. Sometimes, the health care provider will want to wait until the swelling has gone down.    TREATMENT    Often, minor fractures that have caused no deformity do not require treatment. More serious fractures in which bones have moved out of position may require surgery, which will take place after the swelling is gone. Surgery will stabilize and align the fracture. In some cases, a health care provider may be able to reposition the bones without surgery. This may be done in the health care provider's office after medicine is given to numb the area (local anesthetic).    HOME CARE INSTRUCTIONS     If directed, apply ice to the injured area:    ? Put ice in a plastic bag.    ? Place a towel between your skin and the  bag.    ? Leave the ice on for 20 minutes, 2?3 times per day.     Take over-the-counter and prescription medicines only as told by your health care provider.     If your nose starts to bleed, sit in an upright position while you squeeze the soft parts of your nose against the dividing wall between your nostrils (septum) for 10 minutes.     Try to avoid blowing your nose.     Return to your normal activities as told by your health care provider. Ask your health care provider what activities are safe for you.     Avoid contact sports for 3?4 weeks or as told by your health care provider.     Keep all follow-up visits as told by your health care provider. This is important.    SEEK MEDICAL CARE IF:     Your pain increases or becomes severe.     You continue to have nosebleeds.     The shape of your nose does not return to normal within 5 days.     You have pus draining out of your nose.    SEEK IMMEDIATE MEDICAL CARE IF:     You have bleeding from your nose that does not stop after you pinch your nostrils closed for 20 minutes and keep ice on your nose.     You have clear fluid draining out of your nose.     You notice a grape-like swelling on the septum. This swelling is a collection of blood (hematoma) that must be drained to help prevent infection.     You have difficulty moving your eyes.     You have repeated vomiting.    This information is not intended to replace advice given to you by your health care provider. Make sure you discuss any questions you have with your health care provider.    Document Released: 11/04/2000 Document Revised: 07/29/2015 Document Reviewed: 12/15/2014  Elsevier Interactive Patient Education ?2016 Elsevier Inc.       Chest Contusion    A contusion is a deep bruise. Bruises happen when an injury causes bleeding under the skin. Signs of bruising include pain, puffiness (swelling), and discolored skin. The bruise may turn  blue, purple, or yellow.       HOME CARE     Put ice on the injured  area.    ? Put ice in a plastic bag.    ? Place a towel between the skin and the bag.    ? Leave the ice on for 15-20 minutes at a time, 03-04 times a day for the first 48 hours.     Only take medicine as told by your doctor.     Rest.      Take deep breaths (deep-breathing exercises) as told by your doctor.     Stop smoking if you smoke.     Do not lift objects over 5 pounds (2.3 kilograms) for 3 days or longer if told by your doctor.    GET HELP RIGHT AWAY IF:     You have more bruising or puffiness.     You have pain that gets worse.     You have trouble breathing.     You are dizzy, weak, or pass out (faint).     You have blood in your pee (urine) or poop (stool).     You cough up or throw up (vomit) blood.     Your puffiness or pain is not helped with medicines.    MAKE SURE YOU:     Understand these instructions.      Will watch your condition.     Will get help right away if you are not doing well or get worse.    This information is not intended to replace advice given to you by your health care provider. Make sure you discuss any questions you have with your health care provider.    Document Released: 04/25/2008 Document Revised: 08/01/2012 Document Reviewed: 04/30/2012  Elsevier Interactive Patient Education ?2016 Elsevier Inc.         Allergy Info: penicillin     Medication Information:  [facility] provided you with a complete list of medications post discharge, if you have been instructed to stop taking a medication please ensure you also follow up with this information to your Primary Care Physician.  Unless otherwise noted, patient will continue to take medications as prescribed prior to the Emergency Room visit.  Any specific questions regarding your chronic medications and dosages should be discussed with your physician(s) and pharmacist.          ascorbic acid (Vitamin C) 1,000 Milligram Oral (given by mouth) every day.  cyanocobalamin (Vitamin B12)  cyclobenzaprine (Flexeril 5 mg  oral tablet) 1 Tabs Oral (given by mouth) 3 times a day as needed spasm for 5 Days. Refills: 0.  ibuprofen (ibuprofen 800 mg oral tablet) 1 Tabs Oral (given by mouth) 3 times a day as needed moderate pain (4-7) for 3 Days. Refills: 0.      Medications Administered During Visit:              Medication Dose Route   ondansetron 4 mg IV Push   morphine 4 mg IV Push          Major Tests and Procedures:  The following procedures and tests were performed during your ED visit.  COMMON PROCEDURES%>  COMMON PROCEDURES COMMENTS%>          Laboratory Orders  No laboratory orders were placed.              Radiology Orders  Name Status Details   CT Head or Brain w/o Contrast Completed 04/25/20 15:05:00  EDT, STAT 1 hour or less, Reason: ACR Select, Reason: Head trauma, mod-severe, Transport Mode: STRETCHER, Rad Type, pp_set_radiology_subspecialty, [id number], 9, G1004, ME   CT Knee w/o Contrast Left Completed 04/25/20 16:11:00 EDT, STAT 1 hour or less, Reason: ACR Select, Reason: Fracture, knee, Transport Mode: STRETCHER, Rad Type, pp_set_radiology_subspecialty, [id number], 9, G1004, MG   CT Thorax w/ Contrast Completed 04/25/20 15:05:00 EDT, STAT 1 hour or less, Reason: ACR Select, Reason: Chest trauma, mod-severe, Transport Mode: STRETCHER, Rad Type, pp_set_radiology_subspecialty, [id number], 9, G1004, ME   XR Knee 3 Views Left Completed 04/25/20 15:06:00 EDT, STAT 1 hour or less, Reason: Pain, Joint Knee, 3rd View Oblique-Not Sunrise, Transport Mode: STRETCHER, pp_set_radiology_subspecialty   XR Wrist Complete Left Completed 04/25/20 15:06:00 EDT, STAT 1 hour or less, Reason: Pain in left wrist, Transport Mode: STRETCHER, pp_set_radiology_subspecialty               Patient Care Orders  Name Status Details   Ace Wrap Application Completed 04/25/20 17:25:00 EDT, Once, 04/25/20 17:25:00 EDT, To affected area   Discharge Patient Ordered 04/25/20 17:24:00 EDT   Discontinue IV Ordered 04/25/20 17:24:46 EDT, 04/25/20 17:24:46 EDT   ED  Assessment Adult Completed 04/25/20 15:02:53 EDT, 04/25/20 15:02:53 EDT   ED Secondary Triage Completed 04/25/20 15:02:53 EDT, 04/25/20 15:02:53 EDT   ED Triage Adult Completed 04/25/20 14:53:21 EDT, 04/25/20 14:53:21 EDT   Patient Specific Fall Safety Measures Completed 04/25/20 15:05:52 EDT, Once, 04/25/20 15:05:52 EDT, ED Low Fall Risk Documentation       ---------------------------------------------------------------------------------------------------------------------  [facility]) encourages you to self-enroll in the [facility] Patient Portal.  [facility] Patient Portal will allow you to manage your personal health information securely from your own electronic device now and in the future.  To begin your Patient Portal enrollment process, please visit [email or web address] Click on "Sign up now" under [facility].  If you find that you need additional assistance on the [facility] Patient Portal or need a copy of your medical records, please call the [facility] Medical Records Office at [phone].  Comment:

## 2020-04-25 NOTE — Discharge Summary (Signed)
ED Clinical Summary                        [facility]  [address]  [place], [place], [address]  [phone]           PERSON INFORMATION  Name: Haley Pace, Haley Pace Age:  69 Years DOB: Apr 24, 1951   Sex: Female Language: English PCP: PCP,  UNKNOWN   Marital Status: Widowed Phone: [phone] Med Service: MED-Medicine   MRN: [id number] Acct# [id number] Arrival: 04/25/2020 14:52:00   Visit Reason: Rib/trunk pain-swelling; Fall; BRUISES FROM FALL ON SIDEWALK Acuity: 3 LOS: 000 03:02   Address:    [address] [place] [address]   Diagnosis:    Chest wall contusion; Fall; Knee contusion; Nasal fracture; Wrist pain  Medications:          New Medications  Printed Prescriptions  cyclobenzaprine (Flexeril 5 mg oral tablet) 1 Tabs Oral (given by mouth) 3 times a day as needed spasm for 5 Days. Refills: 0.  Last Dose:____________________  ibuprofen (ibuprofen 800 mg oral tablet) 1 Tabs Oral (given by mouth) 3 times a day as needed moderate pain (4-7) for 3 Days. Refills: 0.  Last Dose:____________________  Medications that have not changed  Other Medications  ascorbic acid (Vitamin C) 1,000 Milligram Oral (given by mouth) every day.  Last Dose:____________________  cyanocobalamin (Vitamin B12)   Last Dose:____________________      Medications Administered During Visit:                Medication Dose Route   ondansetron 4 mg IV Push   morphine 4 mg IV Push               Allergies      penicillin (Hives)      Major Tests and Procedures:  The following procedures and tests were performed during your ED visit.  COMMON PROCEDURES%>  COMMON PROCEDURES COMMENTS%>                PROVIDER INFORMATION               Provider Role Assigned Clint Bolder, RN, Jearld Fenton ED Nurse 04/25/2020 14:56:15    LINE, CHRISTOPHER C-MD ED Provider 04/25/2020 14:58:08        Attending Physician:  Precious Gilding C-MD      Admit Doc  LINE,  CHRISTOPHER C-MD     Consulting Doc       VITALS INFORMATION  Vital Sign Triage Latest   Temp  Oral ORAL_1%> ORAL%>   Temp Temporal TEMPORAL_1%> TEMPORAL%>   Temp Intravascular INTRAVASCULAR_1%> INTRAVASCULAR%>   Temp Axillary AXILLARY_1%> AXILLARY%>   Temp Rectal RECTAL_1%> RECTAL%>   02 Sat 99 % 99 %   Respiratory Rate RATE_1%> RATE%>   Peripheral Pulse Rate PULSE RATE_1%>95 bpm PULSE RATE%>   Apical Heart Rate HEART RATE_1%> HEART RATE%>   Blood Pressure BLOOD PRESSURE_1%>/ BLOOD PRESSURE_1%>105 mmHg BLOOD PRESSURE%> / BLOOD PRESSURE%>90 mmHg                 Immunizations      No Immunizations Documented This Visit          DISCHARGE INFORMATION   Discharge Disposition: H Outpt-Sent Home   Discharge Location:  Home   Discharge Date and Time:  04/25/2020 17:54:24   ED Checkout Date and Time:  04/25/2020 17:54:24     DEPART REASON INCOMPLETE INFORMATION  Depart Action Incomplete Reason   Interactive View/I&O Recently assessed               Problems      No Problems Documented              Smoking Status      No Smoking Status Documented         PATIENT EDUCATION INFORMATION  Instructions:     Joint Pain; Musculoskeletal Pain; Nasal Fracture; Chest Contusion, Easy-to-Read     Follow up:                   With: Address: When:   Follow up with primary care provider  In 3 days              ED PROVIDER DOCUMENTATION     Patient:   Haley Pace, Haley Pace             MRN: [id number]            FIN: [id number]               Age:   4 years     Sex:  Female     DOB:  25-Oct-1951   Associated Diagnoses:   Fall; Wrist pain; Knee contusion; Nasal fracture; Chest wall contusion   Author:   LINE,  CHRISTOPHER C-MD      Basic Information   Time seen: Provider Seen (ST)   ED Provider/Time:    LINE,  CHRISTOPHER C-MD / 04/25/2020 14:58  .   Additional information: Chief Complaint from Nursing Triage Note   Chief Complaint  Chief Complaint: Pt arrives after a fall on the sidewalk. Pt is complaining of rib pain, left knee pain, left wrist pain, and nose pain. PT reports difficulty breathing deeply due to pain. PT also reports HA  from hitting head. No LOC and no blood thinners. (04/25/20 14:58:00).      History of Present Illness   The patient presents following fall.  The onset was just prior to arrival.  The occurrence was single episode.  The fall was described as tripped.  The location where the incident occurred was in the street.  Location: Head face trunk upper extremity lower extremity. The character of symptoms is pain.  The degree at present is moderate.  The exacerbating factor is none.  The relieving factor is none.  Risk factors consist of age.  Therapy today: none.  Preceding symptoms chest pain and HA.  Associated symptoms: shortness of breath.  This is a 69 year old female who was walking on one of [place] streets she tripped on the sidewalk and fell forward primarily on her left side she landed on her left knee her left wrist her chest and her nose hit the ground.  She did not lose consciousness but has a bad headache she has pain in her left wrist with movement she has a large hematoma to her left anterior knee.  There is a slight joint effusion there.  She mostly hurts in her anterior chest wall and her sternum is very tender there is no subcutaneous emphysema.  She denies any neck or back injuries.  She will need a CT of her head, scan her chest due to the amount of pain she is having an x-ray her wrist and knee..        Review of Systems   Respiratory symptoms:  Shortness of breath.   Cardiovascular symptoms:  Chest pain, central.  Additional review of systems information: All other systems reviewed and otherwise negative.      Health Status   Allergies:    Allergic Reactions (All)  Moderate  Penicillin- Hives..   Medications:  (Selected)   Inpatient Medications  Ordered  Isovue-300: 100 mL, IV Contrast, Once  Zofran: 4 mg, 2 mL, IV Push, Once  morphine: 4 mg, 1 mL, IV Push, Once  Documented Medications  Documented  Vitamin B12: 0 Refill(s)  Vitamin C: 1,000 mg, Oral, Daily, 0 Refill(s).      Past  Medical/ Family/ Social History   Medical history: Reviewed as documented in chart.   Surgical history: Reviewed as documented in chart.   Family history: Not significant.   Social history: Reviewed as documented in chart.   Problem list:    No qualifying data available  .      Physical Examination               Vital Signs   Vital Signs   04/25/2020 14:58 EDT Systolic Blood Pressure 166 mmHg  HI    Diastolic Blood Pressure 105 mmHg  >HHI    Temperature Oral 37 degC    Heart Rate Monitored 95 bpm    Respiratory Rate 18 br/min    SpO2 99 %   .   Measurements   04/25/2020 15:02 EDT Body Mass Index est meas 26.93 kg/m2    Body Mass Index Measured 26.93 kg/m2   04/25/2020 14:58 EDT Height/Length Measured 163 cm    Weight Dosing 71.54 kg   .   Basic Oxygen Information   04/25/2020 14:58 EDT Oxygen Therapy Room air    SpO2 99 %   .   General:  Alert, mild distress.    Skin:  Warm, dry, pink.    Head:  Slight abrasion and a little bit of swelling of the nose itself.  No septal hematomas..   Neck:  Supple, trachea midline, no tenderness.    Eye:  Extraocular movements are intact, normal conjunctiva.    Ears, nose, mouth and throat:  Oral mucosa moist.   Cardiovascular:  Regular rate and rhythm, No murmur, Normal peripheral perfusion.    Respiratory:  Lungs are clear to auscultation, respirations are non-labored.    Chest wall:  Exquisitely tender anterior chest..   Back:  Nontender, Normal range of motion, Normal alignment.    Musculoskeletal:  Mild bruising and pain with range of motion of her left wrist.  No significant bony tenderness there is some ecchymosis on the lateral aspect of the joint.  She will need an x-ray.  R left knee has an abrasion over the patella and some swelling diffusely.  She does not want to bend her knee.  She will need an x-ray to rule out a fracture..   Gastrointestinal:  Tenderness: Negative, Guarding: Negative, Rebound: Negative, Signs: None.    Neurological:  Alert and oriented to person, place,  time, and situation, No focal neurological deficit observed.    Lymphatics:  No lymphadenopathy.   Psychiatric:  Cooperative, appropriate mood & affect.       Medical Decision Making   Differential Diagnosis:  Fall, abrasion, contusion, closed fracture, head injury.       Reexamination/ Reevaluation   Vital signs   Basic Oxygen Information   04/25/2020 14:58 EDT Oxygen Therapy Room air    SpO2 99 %         Impression and Plan   Diagnosis   Fall (ICD10-CM W19.Lorne Skeens, Discharge,  Medical)   Wrist pain (ICD10-CM M25.539, Discharge, Medical)   Knee contusion (ICD10-CM S80.00XA, Discharge, Medical)   Nasal fracture (ICD10-CM S02.2XXA, Discharge, Medical)   Chest wall contusion (ICD10-CM S20.219A, Discharge, Medical)   Plan   Condition: Stable.    Disposition: Discharged: Time  04/25/2020 17:23:00, to home.    Prescriptions: Launch prescriptions   Pharmacy:  Flexeril 5 mg oral tablet (Prescribe): 5 mg, 1 tabs, Oral, TID, for 5 days, PRN: spasm, 15 tabs, 0 Refill(s)  ibuprofen 800 mg oral tablet (Prescribe): 800 mg, 1 tabs, Oral, TID, for 3 days, PRN: moderate pain (4-7), 12 tabs, 0 Refill(s).    Patient was given the following educational materials: Chest Contusion, Easy-to-Read, Nasal Fracture, Musculoskeletal Pain, Joint Pain, Joint Pain, Musculoskeletal Pain, Nasal Fracture, Chest Contusion, Easy-to-Read.    Follow up with: Follow up with primary care provider In 3 days, Return to Emergency Department, In: as needed.    Counseled: Patient.

## 2020-04-25 NOTE — ED Provider Notes (Signed)
Fall        Patient:   Haley Pace, Haley Pace             MRN: [id number]            FIN: [id number]               Age:   69 years     Sex:  Female     DOB:  08/21/1951   Associated Diagnoses:   Fall; Wrist pain; Knee contusion; Nasal fracture; Chest wall contusion   Author:   Astin Sayre,  Lakeya Mulka C-MD      Basic Information   Time seen: Provider Seen (ST)   ED Provider/Time:    Alyla Pietila,  Brandn Mcgath C-MD / 04/25/2020 14:58  .   Additional information: Chief Complaint from Nursing Triage Note   Chief Complaint  Chief Complaint: Pt arrives after a fall on the sidewalk. Pt is complaining of rib pain, left knee pain, left wrist pain, and nose pain. PT reports difficulty breathing deeply due to pain. PT also reports HA from hitting head. No LOC and no blood thinners. (04/25/20 14:58:00).      History of Present Illness   The patient presents following fall.  The onset was just prior to arrival.  The occurrence was single episode.  The fall was described as tripped.  The location where the incident occurred was in the street.  Location: Head face trunk upper extremity lower extremity. The character of symptoms is pain.  The degree at present is moderate.  The exacerbating factor is none.  The relieving factor is none.  Risk factors consist of age.  Therapy today: none.  Preceding symptoms chest pain and HA.  Associated symptoms: shortness of breath.  This is a 69 year old female who was walking on one of [place] streets she tripped on the sidewalk and fell forward primarily on her left side she landed on her left knee her left wrist her chest and her nose hit the ground.  She did not lose consciousness but has a bad headache she has pain in her left wrist with movement she has a large hematoma to her left anterior knee.  There is a slight joint effusion there.  She mostly hurts in her anterior chest wall and her sternum is very tender there is no subcutaneous emphysema.  She denies any neck or back injuries.  She will need a CT of  her head, scan her chest due to the amount of pain she is having an x-ray her wrist and knee..        Review of Systems   Respiratory symptoms:  Shortness of breath.   Cardiovascular symptoms:  Chest pain, central.              Additional review of systems information: All other systems reviewed and otherwise negative.      Health Status   Allergies:    Allergic Reactions (All)  Moderate  Penicillin- Hives..   Medications:  (Selected)   Inpatient Medications  Ordered  Isovue-300: 100 mL, IV Contrast, Once  Zofran: 4 mg, 2 mL, IV Push, Once  morphine: 4 mg, 1 mL, IV Push, Once  Documented Medications  Documented  Vitamin B12: 0 Refill(s)  Vitamin C: 1,000 mg, Oral, Daily, 0 Refill(s).      Past Medical/ Family/ Social History   Medical history: Reviewed as documented in chart.   Surgical history: Reviewed as documented in chart.   Family history: Not significant.   Social history:  Reviewed as documented in chart.   Problem list:    No qualifying data available  .      Physical Examination               Vital Signs   Vital Signs   04/25/2020 14:58 EDT Systolic Blood Pressure 166 mmHg  HI    Diastolic Blood Pressure 105 mmHg  >HHI    Temperature Oral 37 degC    Heart Rate Monitored 95 bpm    Respiratory Rate 18 br/min    SpO2 99 %   .   Measurements   04/25/2020 15:02 EDT Body Mass Index est meas 26.93 kg/m2    Body Mass Index Measured 26.93 kg/m2   04/25/2020 14:58 EDT Height/Length Measured 163 cm    Weight Dosing 71.54 kg   .   Basic Oxygen Information   04/25/2020 14:58 EDT Oxygen Therapy Room air    SpO2 99 %   .   General:  Alert, mild distress.    Skin:  Warm, dry, pink.    Head:  Slight abrasion and a little bit of swelling of the nose itself.  No septal hematomas..   Neck:  Supple, trachea midline, no tenderness.    Eye:  Extraocular movements are intact, normal conjunctiva.    Ears, nose, mouth and throat:  Oral mucosa moist.   Cardiovascular:  Regular rate and rhythm, No murmur, Normal peripheral perfusion.     Respiratory:  Lungs are clear to auscultation, respirations are non-labored.    Chest wall:  Exquisitely tender anterior chest..   Back:  Nontender, Normal range of motion, Normal alignment.    Musculoskeletal:  Mild bruising and pain with range of motion of her left wrist.  No significant bony tenderness there is some ecchymosis on the lateral aspect of the joint.  She will need an x-ray.  R left knee has an abrasion over the patella and some swelling diffusely.  She does not want to bend her knee.  She will need an x-ray to rule out a fracture..   Gastrointestinal:  Tenderness: Negative, Guarding: Negative, Rebound: Negative, Signs: None.    Neurological:  Alert and oriented to person, place, time, and situation, No focal neurological deficit observed.    Lymphatics:  No lymphadenopathy.   Psychiatric:  Cooperative, appropriate mood & affect.       Medical Decision Making   Differential Diagnosis:  Fall, abrasion, contusion, closed fracture, head injury.       Reexamination/ Reevaluation   Vital signs   Basic Oxygen Information   04/25/2020 14:58 EDT Oxygen Therapy Room air    SpO2 99 %         Impression and Plan   Diagnosis   Fall (ICD10-CM W19.Lorne Skeens, Discharge, Medical)   Wrist pain (ICD10-CM M25.539, Discharge, Medical)   Knee contusion (ICD10-CM S80.00XA, Discharge, Medical)   Nasal fracture (ICD10-CM S02.2XXA, Discharge, Medical)   Chest wall contusion (ICD10-CM S20.219A, Discharge, Medical)   Plan   Condition: Stable.    Disposition: Discharged: Time  04/25/2020 17:23:00, to home.    Prescriptions: Launch prescriptions   Pharmacy:  Flexeril 5 mg oral tablet (Prescribe): 5 mg, 1 tabs, Oral, TID, for 5 days, PRN: spasm, 15 tabs, 0 Refill(s)  ibuprofen 800 mg oral tablet (Prescribe): 800 mg, 1 tabs, Oral, TID, for 3 days, PRN: moderate pain (4-7), 12 tabs, 0 Refill(s).    Patient was given the following educational materials: Chest Contusion, Easy-to-Read, Nasal Fracture, Musculoskeletal Pain, Joint Pain, Joint  Pain, Musculoskeletal Pain, Nasal Fracture, Chest Contusion, Easy-to-Read.    Follow up with: Follow up with primary care provider In 3 days, Return to Emergency Department, In: as needed.    Counseled: Patient.    Signature Elder Davidian     Electronically Signed on 04/25/2020 05:24 PM EDT   ________________________________________________   Precious Gilding C-MD               Modified byPrecious Gilding C-MD on 04/25/2020 05:24 PM EDT

## 2020-04-25 NOTE — ED Notes (Signed)
ED Triage Note       ED Triage Adult Entered On:  04/25/2020 15:02 EDT    Performed On:  04/25/2020 14:58 EDT by Sherrie George, RN, Jearld Fenton               Triage   Chief Complaint :   Pt arrives after a fall on the sidewalk. Pt is complaining of rib pain, left knee pain, left wrist pain, and nose pain. PT reports difficulty breathing deeply due to pain. PT also reports HA from hitting head. No LOC and no blood thinners.   Numeric Rating Pain Scale :   8   [place] Mode of Arrival :   Walking   Infectious Disease Documentation :   Document assessment   Temperature Oral :   37 degC(Converted to: 98.6 degF)    Heart Rate Monitored :   95 bpm   Respiratory Rate :   18 br/min   Systolic Blood Pressure :   166 mmHg (HI)    Diastolic Blood Pressure :   105 mmHg (>HHI)    SpO2 :   99 %   Oxygen Therapy :   Room air   Patient presentation :   None of the above   Chief Complaint or Presentation suggest infection :   No   Dosing Weight Obtained By :   Measured   Weight Dosing :   71.54 kg(Converted to: 157 lb 12 oz)    Height :   163 cm(Converted to: 5 ft 4 in)    Body Mass Index Dosing :   27 kg/m2   Ulyess Mort - 04/25/2020 14:58 EDT   DCP GENERIC CODE   Tracking Acuity :   3   Tracking Group :   ED [employer]   Sherrie George, RN, Jearld Fenton - 04/25/2020 14:58 EDT   ED General Section :   Document assessment   Pregnancy Status :   N/A   ED Allergies Section :   Document assessment   ED Reason for Visit Section :   Document assessment   ED Quick Assessment :   Patient appears awake, alert, oriented to baseline. Skin warm and dry. Moves all extremities. Respiration even and unlabored. Appears in no apparent distress.   Sherrie George, RN, Jearld Fenton - 04/25/2020 14:58 EDT   ID Risk Screen Symptoms   Recent Travel History :   No recent travel   Close Contact with COVID-19 ID :   No   Last 14 days COVID-19 ID :   No   TB Symptom Screen :   No symptoms   C. diff Symptom/History ID :   Neither of the above   Ulyess Mort - 04/25/2020 14:58  EDT   Allergies   (As Of: 04/25/2020 15:02:52 EDT)   Allergies (Active)   penicillin  Estimated Onset Date:   Unspecified ; Reactions:   Hives ; Created By:   Sherrie George, RN, Aileen C; Reaction Status:   Active ; Category:   Drug ; Substance:   penicillin ; Type:   Allergy ; Severity:   Moderate ; Updated By:   Sherrie George RN, Jearld Fenton; Reviewed Date:   04/25/2020 15:02 EDT        Psycho-Social   Last 3 mo, thoughts killing self/others :   Patient denies   Right click within box for Suspected Abuse policy link. :   None   Feels Safe Where Live :   No  Sherrie George RN, Jearld Fenton - 04/25/2020 14:58 EDT   ED Reason for Visit   (As Of: 04/25/2020 15:02:52 EDT)   Diagnoses(Active)    Fall  Date:   04/25/2020 ; Diagnosis Type:   Reason For Visit ; Confirmation:   Complaint of ; Clinical Dx:   Fall ; Classification:   Medical ; Clinical Service:   Emergency medicine ; Code:   PNED ; Probability:   0 ; Diagnosis Code:   [id number]      Rib/trunk pain-swelling  Date:   04/25/2020 ; Diagnosis Type:   Reason For Visit ; Confirmation:   Complaint of ; Clinical Dx:   Rib/trunk pain-swelling ; Classification:   Medical ; Clinical Service:   Emergency medicine ; Code:   PNED ; Probability:   0 ; Diagnosis Code:   [id number]

## 2020-04-25 NOTE — ED Notes (Signed)
ED Triage Note       ED Secondary Triage Entered On:  04/25/2020 15:05 EDT    Performed On:  04/25/2020 15:03 EDT by Sherrie George, RN, Jearld Fenton               General Information   Barriers to Learning :   None evident   Languages :   English   ED Home Meds Section :   Document assessment   [facility] ED Fall Risk Section :   Document assessment   ED Advance Directives Section :   Document assessment   ED Palliative Screen :   Document assessment   Ulyess Mort - 04/25/2020 15:03 EDT   (As Of: 04/25/2020 15:05:52 EDT)   Diagnoses(Active)    Fall  Date:   04/25/2020 ; Diagnosis Type:   Reason For Visit ; Confirmation:   Complaint of ; Clinical Dx:   Fall ; Classification:   Medical ; Clinical Service:   Emergency medicine ; Code:   PNED ; Probability:   0 ; Diagnosis Code:   [id number]      Rib/trunk pain-swelling  Date:   04/25/2020 ; Diagnosis Type:   Reason For Visit ; Confirmation:   Complaint of ; Clinical Dx:   Rib/trunk pain-swelling ; Classification:   Medical ; Clinical Service:   Emergency medicine ; Code:   PNED ; Probability:   0 ; Diagnosis Code:   [id number]             -    Procedure History   (As Of: 04/25/2020 15:05:52 EDT)     Phoebe Perch Fall Risk Assessment Tool   Hx of falling last 3 months ED Fall :   Yes (Single mechanical fall)   Patient confused or disoriented ED Fall :   No   Patient intoxicated or sedated ED Fall :   No   Patient impaired gait ED Fall :   No   Use a mobility assistance device ED Fall :   No   Patient altered elimination ED Fall :   No   [facility] ED Fall Score :   1    Ulyess Mort - 04/25/2020 15:03 EDT   ED Advance Directive   Advance Directive :   No   Sherrie George, RN, Jearld Fenton - 04/25/2020 15:03 EDT   Palliative Care   Does the Patient have a Life Limiting Illness :   None of the above   Sherrie George, RN, Jearld Fenton - 04/25/2020 15:03 EDT   Med Hx   Medication List   (As Of: 04/25/2020 15:05:52 EDT)   Home Meds    ascorbic acid  :   ascorbic acid ;  Status:   Documented ; Ordered As Mnemonic:   Vitamin C ; Simple Display Line:   1,000 mg, Oral, Daily, 0 Refill(s) ; Catalog Code:   ascorbic acid ; Order Dt/Tm:   04/25/2020 15:04:57 EDT          cyanocobalamin  :   cyanocobalamin ; Status:   Documented ; Ordered As Mnemonic:   Vitamin B12 ; Simple Display Line:   0 Refill(s) ; Catalog Code:   cyanocobalamin ; Order Dt/Tm:   04/25/2020 15:04:51 EDT

## 2020-04-25 NOTE — ED Notes (Signed)
ED Patient Education Note     Patient Education Materials Follows:  ENT     Nasal Fracture    A nasal fracture is a break or crack in the bones or cartilage of the nose. Minor breaks do not require treatment. These breaks usually heal on their own after about one month. Serious breaks may require surgery.      CAUSES    This injury is usually caused by a blunt injury to the nose. This type of injury often occurs from:     Contact sports.     Car accidents.     Falls.     Getting punched.    SYMPTOMS    Symptoms of this injury include:     Pain.     Swelling of the nose.     Bleeding from the nose.     Bruising around the nose or eyes. This may include having black eyes.     Crooked appearance of the nose.    DIAGNOSIS    This injury may be diagnosed with a physical exam. The health care provider will gently feel the nose for signs of broken bones. He or she will look inside the nostrils to make sure that there is not a blood-filled swelling on the dividing wall between the nostrils (septal hematoma). X-rays of the nose may not show a nasal fracture even when one is present. In some cases, X-rays or a CT scan may be done 1?5 days after the injury. Sometimes, the health care provider will want to wait until the swelling has gone down.    TREATMENT    Often, minor fractures that have caused no deformity do not require treatment. More serious fractures in which bones have moved out of position may require surgery, which will take place after the swelling is gone. Surgery will stabilize and align the fracture. In some cases, a health care provider may be able to reposition the bones without surgery. This may be done in the health care provider's office after medicine is given to numb the area (local anesthetic).    HOME CARE INSTRUCTIONS     If directed, apply ice to the injured area:    ? Put ice in a plastic bag.    ? Place a towel between your skin and the bag.    ? Leave the ice on for 20 minutes, 2?3 times per  day.     Take over-the-counter and prescription medicines only as told by your health care provider.     If your nose starts to bleed, sit in an upright position while you squeeze the soft parts of your nose against the dividing wall between your nostrils (septum) for 10 minutes.     Try to avoid blowing your nose.     Return to your normal activities as told by your health care provider. Ask your health care provider what activities are safe for you.     Avoid contact sports for 3?4 weeks or as told by your health care provider.     Keep all follow-up visits as told by your health care provider. This is important.    SEEK MEDICAL CARE IF:     Your pain increases or becomes severe.     You continue to have nosebleeds.     The shape of your nose does not return to normal within 5 days.     You have pus draining out of your nose.    SEEK IMMEDIATE   MEDICAL CARE IF:     You have bleeding from your nose that does not stop after you pinch your nostrils closed for 20 minutes and keep ice on your nose.     You have clear fluid draining out of your nose.     You notice a grape-like swelling on the septum. This swelling is a collection of blood (hematoma) that must be drained to help prevent infection.     You have difficulty moving your eyes.     You have repeated vomiting.    This information is not intended to replace advice given to you by your health care provider. Make sure you discuss any questions you have with your health care provider.    Document Released: 11/04/2000 Document Revised: 07/29/2015 Document Reviewed: 12/15/2014  Elsevier Interactive Patient Education ?2016 [employer]      Easy-to-Read     Chest Contusion    A contusion is a deep bruise. Bruises happen when an injury causes bleeding under the skin. Signs of bruising include pain, puffiness (swelling), and discolored skin. The bruise may turn blue, purple, or yellow.       HOME CARE     Put ice on the injured area.    ? Put ice in a plastic bag.     ? Place a towel between the skin and the bag.    ? Leave the ice on for 15-20 minutes at a time, 03-04 times a day for the first 48 hours.     Only take medicine as told by your doctor.     Rest.      Take deep breaths (deep-breathing exercises) as told by your doctor.     Stop smoking if you smoke.     Do not lift objects over 5 pounds (2.3 kilograms) for 3 days or longer if told by your doctor.    GET HELP RIGHT AWAY IF:     You have more bruising or puffiness.     You have pain that gets worse.     You have trouble breathing.     You are dizzy, weak, or pass out (faint).     You have blood in your pee (urine) or poop (stool).     You cough up or throw up (vomit) blood.     Your puffiness or pain is not helped with medicines.    MAKE SURE YOU:     Understand these instructions.      Will watch your condition.     Will get help right away if you are not doing well or get worse.    This information is not intended to replace advice given to you by your health care provider. Make sure you discuss any questions you have with your health care provider.    Document Released: 04/25/2008 Document Revised: 08/01/2012 Document Reviewed: 04/30/2012  Elsevier Interactive Patient Education ?2016 [employer]      Musculoskeletal     Joint Pain    Joint pain, which is also called arthralgia, can be caused by many things. Joint pain often goes away when you follow your health care provider's instructions for relieving pain at home. However, joint pain can also be caused by conditions that require further treatment. Common causes of joint pain include:     Bruising in the area of the joint.     Overuse of the joint.     Wear and tear on the joints that occur with aging (osteoarthritis).  Various other forms of arthritis.     A buildup of a crystal form of uric acid in the joint (gout).     Infections of the joint (septic arthritis) or of the bone (osteomyelitis).    Your health care provider may recommend medicine to help  with the pain. If your joint pain continues, additional tests may be needed to diagnose your condition.    HOME CARE INSTRUCTIONS    Watch your condition for any changes. Follow these instructions as directed to lessen the pain that you are feeling.     Take medicines only as directed by your health care provider.     Rest the affected area for as long as your health care provider says that you should. If directed to do so, raise the painful joint above the level of your heart while you are sitting or lying down.     Do not do things that cause or worsen pain.     If directed, apply ice to the painful area:    ? Put ice in a plastic bag.    ? Place a towel between your skin and the bag.    ? Leave the ice on for 20 minutes, 2?3 times per day.     Wear an elastic bandage, splint, or sling as directed by your health care provider. Loosen the elastic bandage or splint if your fingers or toes become numb and tingle, or if they turn cold and blue.     Begin exercising or stretching the affected area as directed by your health care provider. Ask your health care provider what types of exercise are safe for you.     Keep all follow-up visits as directed by your health care provider. This is important.    SEEK MEDICAL CARE IF:     Your pain increases, and medicine does not help.     Your joint pain does not improve within 3 days.     You have increased bruising or swelling.     You have a fever.     You lose 10 lb (4.5 kg) or more without trying.    SEEK IMMEDIATE MEDICAL CARE IF:     You are not able to move the joint.      Your fingers or toes become numb or they turn cold and blue.    This information is not intended to replace advice given to you by your health care provider. Make sure you discuss any questions you have with your health care provider.    Document Released: 11/07/2005 Document Revised: 11/28/2014 Document Reviewed: 08/19/2014  Elsevier Interactive Patient Education ?2016 [employer]          Musculoskeletal Pain    Musculoskeletal pain is muscle and boney aches and pains. These pains can occur in any part of the body. Your caregiver may treat you without knowing the cause of the pain. They may treat you if blood or urine tests, X-rays, and other tests were normal.     CAUSES    There is often not a definite cause or reason for these pains. These pains may be caused by a type of germ (virus). The discomfort may also come from overuse. Overuse includes working out too hard when your body is not fit. Boney aches also come from weather changes. Bone is sensitive to atmospheric pressure changes.    HOME CARE INSTRUCTIONS     Ask when your test results will be ready. Make sure  you get your test results.     Only take over-the-counter or prescription medicines for pain, discomfort, or fever as directed by your caregiver. If you were given medications for your condition, do not drive, operate machinery or power tools, or sign legal documents for 24 hours. Do not drink alcohol. Do not take sleeping pills or other medications that may interfere with treatment.     Continue all activities unless the activities cause more pain. When the pain lessens, slowly resume normal activities. Gradually increase the intensity and duration of the activities or exercise.      During periods of severe pain, bed rest may be helpful. Lay or sit in any position that is comfortable.      Putting ice on the injured area.    ? Put ice in a bag.     ? Place a towel between your skin and the bag.    ? Leave the ice on for 15 to 20 minutes, 3 to 4 times a day.      Follow up with your caregiver for continued problems and no reason can be found for the pain. If the pain becomes worse or does not go away, it may be necessary to repeat tests or do additional testing. Your caregiver may need to look further for a possible cause.    SEEK IMMEDIATE MEDICAL CARE IF:     You have pain that is getting worse and is not relieved by medications.      You develop chest pain that is associated with shortness or breath, sweating, feeling sick to your stomach (nauseous), or throw up (vomit).     Your pain becomes localized to the abdomen.     You develop any new symptoms that seem different or that concern you.    MAKE SURE YOU:     Understand these instructions.      Will watch your condition.     Will get help right away if you are not doing well or get worse.    This information is not intended to replace advice given to you by your health care provider. Make sure you discuss any questions you have with your health care provider.    Document Released: 11/07/2005 Document Revised: 01/30/2012 Document Reviewed: 07/12/2013  [employer] Interactive Patient Education ?2016 [employer].

## 2020-04-25 NOTE — ED Notes (Signed)
ED Patient Education Note     Patient Education Materials Follows:  ENT     Nasal Fracture    A nasal fracture is a break or crack in the bones or cartilage of the nose. Minor breaks do not require treatment. These breaks usually heal on their own after about one month. Serious breaks may require surgery.      CAUSES    This injury is usually caused by a blunt injury to the nose. This type of injury often occurs from:     Contact sports.     Car accidents.     Falls.     Getting punched.    SYMPTOMS    Symptoms of this injury include:     Pain.     Swelling of the nose.     Bleeding from the nose.     Bruising around the nose or eyes. This may include having black eyes.     Crooked appearance of the nose.    DIAGNOSIS    This injury may be diagnosed with a physical exam. The health care provider will gently feel the nose for signs of broken bones. He or she will look inside the nostrils to make sure that there is not a blood-filled swelling on the dividing wall between the nostrils (septal hematoma). X-rays of the nose may not show a nasal fracture even when one is present. In some cases, X-rays or a CT scan may be done 1?5 days after the injury. Sometimes, the health care provider will want to wait until the swelling has gone down.    TREATMENT    Often, minor fractures that have caused no deformity do not require treatment. More serious fractures in which bones have moved out of position may require surgery, which will take place after the swelling is gone. Surgery will stabilize and align the fracture. In some cases, a health care provider may be able to reposition the bones without surgery. This may be done in the health care provider's office after medicine is given to numb the area (local anesthetic).    HOME CARE INSTRUCTIONS     If directed, apply ice to the injured area:    ? Put ice in a plastic bag.    ? Place a towel between your skin and the bag.    ? Leave the ice on for 20 minutes, 2?3 times per  day.     Take over-the-counter and prescription medicines only as told by your health care provider.     If your nose starts to bleed, sit in an upright position while you squeeze the soft parts of your nose against the dividing wall between your nostrils (septum) for 10 minutes.     Try to avoid blowing your nose.     Return to your normal activities as told by your health care provider. Ask your health care provider what activities are safe for you.     Avoid contact sports for 3?4 weeks or as told by your health care provider.     Keep all follow-up visits as told by your health care provider. This is important.    SEEK MEDICAL CARE IF:     Your pain increases or becomes severe.     You continue to have nosebleeds.     The shape of your nose does not return to normal within 5 days.     You have pus draining out of your nose.    SEEK IMMEDIATE   MEDICAL CARE IF:     You have bleeding from your nose that does not stop after you pinch your nostrils closed for 20 minutes and keep ice on your nose.     You have clear fluid draining out of your nose.     You notice a grape-like swelling on the septum. This swelling is a collection of blood (hematoma) that must be drained to help prevent infection.     You have difficulty moving your eyes.     You have repeated vomiting.    This information is not intended to replace advice given to you by your health care provider. Make sure you discuss any questions you have with your health care provider.    Document Released: 11/04/2000 Document Revised: 07/29/2015 Document Reviewed: 12/15/2014  Elsevier Interactive Patient Education ?2016 [employer]      Easy-to-Read     Chest Contusion    A contusion is a deep bruise. Bruises happen when an injury causes bleeding under the skin. Signs of bruising include pain, puffiness (swelling), and discolored skin. The bruise may turn blue, purple, or yellow.       HOME CARE     Put ice on the injured area.    ? Put ice in a plastic bag.     ? Place a towel between the skin and the bag.    ? Leave the ice on for 15-20 minutes at a time, 03-04 times a day for the first 48 hours.     Only take medicine as told by your doctor.     Rest.      Take deep breaths (deep-breathing exercises) as told by your doctor.     Stop smoking if you smoke.     Do not lift objects over 5 pounds (2.3 kilograms) for 3 days or longer if told by your doctor.    GET HELP RIGHT AWAY IF:     You have more bruising or puffiness.     You have pain that gets worse.     You have trouble breathing.     You are dizzy, weak, or pass out (faint).     You have blood in your pee (urine) or poop (stool).     You cough up or throw up (vomit) blood.     Your puffiness or pain is not helped with medicines.    MAKE SURE YOU:     Understand these instructions.      Will watch your condition.     Will get help right away if you are not doing well or get worse.    This information is not intended to replace advice given to you by your health care provider. Make sure you discuss any questions you have with your health care provider.    Document Released: 04/25/2008 Document Revised: 08/01/2012 Document Reviewed: 04/30/2012  [employer]vier Interactive Patient Education ?2016 [employer]      Musculoskeletal     Joint Pain    Joint pain, which is also called arthralgia, can be caused by many things. Joint pain often goes away when you follow your health care provider's instructions for relieving pain at home. However, joint pain can also be caused by conditions that require further treatment. Common causes of joint pain include:     Bruising in the area of the joint.     Overuse of the joint.     Wear and tear on the joints that occur with aging (osteoarthritis).  Various other forms of arthritis.     A buildup of a crystal form of uric acid in the joint (gout).     Infections of the joint (septic arthritis) or of the bone (osteomyelitis).    Your health care provider may recommend medicine to help  with the pain. If your joint pain continues, additional tests may be needed to diagnose your condition.    HOME CARE INSTRUCTIONS    Watch your condition for any changes. Follow these instructions as directed to lessen the pain that you are feeling.     Take medicines only as directed by your health care provider.     Rest the affected area for as long as your health care provider says that you should. If directed to do so, raise the painful joint above the level of your heart while you are sitting or lying down.     Do not do things that cause or worsen pain.     If directed, apply ice to the painful area:    ? Put ice in a plastic bag.    ? Place a towel between your skin and the bag.    ? Leave the ice on for 20 minutes, 2?3 times per day.     Wear an elastic bandage, splint, or sling as directed by your health care provider. Loosen the elastic bandage or splint if your fingers or toes become numb and tingle, or if they turn cold and blue.     Begin exercising or stretching the affected area as directed by your health care provider. Ask your health care provider what types of exercise are safe for you.     Keep all follow-up visits as directed by your health care provider. This is important.    SEEK MEDICAL CARE IF:     Your pain increases, and medicine does not help.     Your joint pain does not improve within 3 days.     You have increased bruising or swelling.     You have a fever.     You lose 10 lb (4.5 kg) or more without trying.    SEEK IMMEDIATE MEDICAL CARE IF:     You are not able to move the joint.      Your fingers or toes become numb or they turn cold and blue.    This information is not intended to replace advice given to you by your health care provider. Make sure you discuss any questions you have with your health care provider.    Document Released: 11/07/2005 Document Revised: 11/28/2014 Document Reviewed: 08/19/2014  [employer]vier Interactive Patient Education ?2016 [employer]          Musculoskeletal Pain    Musculoskeletal pain is muscle and boney aches and pains. These pains can occur in any part of the body. Your caregiver may treat you without knowing the cause of the pain. They may treat you if blood or urine tests, X-rays, and other tests were normal.     CAUSES    There is often not a definite cause or reason for these pains. These pains may be caused by a type of germ (virus). The discomfort may also come from overuse. Overuse includes working out too hard when your body is not fit. Boney aches also come from weather changes. Bone is sensitive to atmospheric pressure changes.    HOME CARE INSTRUCTIONS     Ask when your test results will be ready. Make sure   you get your test results.     Only take over-the-counter or prescription medicines for pain, discomfort, or fever as directed by your caregiver. If you were given medications for your condition, do not drive, operate machinery or power tools, or sign legal documents for 24 hours. Do not drink alcohol. Do not take sleeping pills or other medications that may interfere with treatment.     Continue all activities unless the activities cause more pain. When the pain lessens, slowly resume normal activities. Gradually increase the intensity and duration of the activities or exercise.      During periods of severe pain, bed rest may be helpful. Lay or sit in any position that is comfortable.      Putting ice on the injured area.    ? Put ice in a bag.     ? Place a towel between your skin and the bag.    ? Leave the ice on for 15 to 20 minutes, 3 to 4 times a day.      Follow up with your caregiver for continued problems and no reason can be found for the pain. If the pain becomes worse or does not go away, it may be necessary to repeat tests or do additional testing. Your caregiver may need to look further for a possible cause.    SEEK IMMEDIATE MEDICAL CARE IF:     You have pain that is getting worse and is not relieved by medications.      You develop chest pain that is associated with shortness or breath, sweating, feeling sick to your stomach (nauseous), or throw up (vomit).     Your pain becomes localized to the abdomen.     You develop any new symptoms that seem different or that concern you.    MAKE SURE YOU:     Understand these instructions.      Will watch your condition.     Will get help right away if you are not doing well or get worse.    This information is not intended to replace advice given to you by your health care provider. Make sure you discuss any questions you have with your health care provider.    Document Released: 11/07/2005 Document Revised: 01/30/2012 Document Reviewed: 07/12/2013  [employer] Interactive Patient Education ?2016 [employer].

## 2020-04-29 ENCOUNTER — Other Ambulatory Visit: Payer: Self-pay

## 2020-04-30 ENCOUNTER — Encounter: Payer: Self-pay | Admitting: Family Medicine

## 2020-04-30 ENCOUNTER — Ambulatory Visit (INDEPENDENT_AMBULATORY_CARE_PROVIDER_SITE_OTHER): Payer: Medicare Other | Admitting: Family Medicine

## 2020-04-30 VITALS — BP 120/80 | HR 94 | Temp 97.5°F | Ht 64.0 in | Wt 155.4 lb

## 2020-04-30 DIAGNOSIS — S20212D Contusion of left front wall of thorax, subsequent encounter: Secondary | ICD-10-CM

## 2020-04-30 DIAGNOSIS — Z1211 Encounter for screening for malignant neoplasm of colon: Secondary | ICD-10-CM

## 2020-04-30 DIAGNOSIS — L989 Disorder of the skin and subcutaneous tissue, unspecified: Secondary | ICD-10-CM

## 2020-04-30 DIAGNOSIS — S022XXD Fracture of nasal bones, subsequent encounter for fracture with routine healing: Secondary | ICD-10-CM

## 2020-04-30 DIAGNOSIS — L821 Other seborrheic keratosis: Secondary | ICD-10-CM

## 2020-04-30 DIAGNOSIS — Z86010 Personal history of colonic polyps: Secondary | ICD-10-CM

## 2020-04-30 DIAGNOSIS — W19XXXA Unspecified fall, initial encounter: Secondary | ICD-10-CM

## 2020-04-30 DIAGNOSIS — M1712 Unilateral primary osteoarthritis, left knee: Secondary | ICD-10-CM

## 2020-04-30 MED ORDER — CYCLOBENZAPRINE HCL 5 MG PO TABS: 5.0000 mg | ORAL_TABLET | Freq: Every day | ORAL | 0 refills | Status: DC

## 2020-04-30 NOTE — Progress Notes (Signed)
Crystal Blair is a 69 y.o. female  Chief Complaint  Patient presents with  . Acute Visit    Pt is here for a fall follow up. Pt fell Saturday, pt went to ER.  Pt would like a referral for eye doctor, GI and demertologist    HPI: Crystal Blair is a 69 y.o. female is a former patient of Dr. Deborra Medina who is seen today for f/u after a fall on 04/25/20 while out of town in [place], [place]. She tripped and fell on uneven sidewalk outside of [employer]. Pt was seen in ER at [facility] [phone]) in [facility]. A summary of the encounter is available in [place] and has been reviewed by me today. She had CT head, CT Lt knee, CT thorax, knee Lt xray, Lt wrist xray. Pt was discharged from ER with following dx: Fall (Discharge Diagnosis)  Wrist pain (Discharge Diagnosis)  Knee contusion (Discharge Diagnosis)  Nasal fracture (Discharge Diagnosis)  Chest wall contusion (Discharge Diagnosis) She is taking ibuprofen 800mg  and flexeril 5mg  which does help. She needs a refill of flexeril.  Pt states she is "beyond sore". Denies SOB but difficult to take full, deep breath. She states she is "getting a little better each day".  Pt is also requesting multiple referrals: Dermatologist - scalp lesions, ? [facility] - h/o polyps, due for screening colonoscopy and pt with chronic hematochezia. Previous colo done in [place] in [place] as well as GI in  [place], [place]. Ophthalmology - routine eye exam and updated Rx for glasses Ortho - h/o Lt knee OA   Past Medical History:  Diagnosis Date  . Arthritis   . History of chicken pox   . Tubular adenoma 2015   Per Colonoscopy/q12yrs    Past Surgical History:  Procedure Laterality Date  . BREAST BIOPSY Left 2015  . BREAST EXCISIONAL BIOPSY Left   . CHOLECYSTECTOMY  1991    Social History   Socioeconomic History  . Marital status: Widowed    Spouse name: Not on file  . Number of children: Not on file  . Years of education: Not on file  .  Highest education level: Not on file  Occupational History  . Not on file  Tobacco Use  . Smoking status: Never Smoker  . Smokeless tobacco: Never Used  Vaping Use  . Vaping Use: Never used  Substance and Sexual Activity  . Alcohol use: Yes    Comment: ccas  . Drug use: Never  . Sexual activity: Yes  Other Topics Concern  . Not on file  Social History Narrative  . Not on file   Social Determinants of Health   Financial Resource Strain:   . Difficulty of Paying Living Expenses:   Food Insecurity:   . Worried About Charity fundraiser in the Last Year:   . Arboriculturist in the Last Year:   Transportation Needs:   . Film/video editor (Medical):   Marland Kitchen Lack of Transportation (Non-Medical):   Physical Activity:   . Days of Exercise per Week:   . Minutes of Exercise per Session:   Stress:   . Feeling of Stress :   Social Connections:   . Frequency of Communication with Friends and Family:   . Frequency of Social Gatherings with Friends and Family:   . Attends Religious Services:   . Active Member of Clubs or Organizations:   . Attends Archivist Meetings:   .  Marital Status:   Intimate Partner Violence:   . Fear of Current or Ex-Partner:   . Emotionally Abused:   Marland Kitchen Physically Abused:   . Sexually Abused:     Family History  Problem Relation Age of Onset  . Arthritis Mother   . COPD Mother   . Depression Mother   . Heart attack Mother   . Alcohol abuse Father   . Early death Father   . Hyperlipidemia Father   . Hypertension Father   . Cancer Sister   . Hypertension Sister      Immunization History  Administered Date(s) Administered  . Fluad Quad(high Dose 65+) 07/31/2019  . PFIZER SARS-COV-2 Vaccination 01/12/2020, 02/05/2020  . Pneumococcal Conjugate-13 09/11/2019    Outpatient Encounter Medications as of 04/30/2020  Medication Sig  . Ascorbic Acid (VITAMIN C ADULT GUMMIES PO) Take by mouth.  Marland Kitchen BIOTIN PO Take 3,000 mcg by mouth daily.  .  Calcium Carbonate (CALTRATE 600 PO) Take by mouth.  . cyclobenzaprine (FLEXERIL) 5 MG tablet  5 mg = 1 tabs, Oral, TID, PRN, X 5 days, # 15 tabs, 0 Refill(s), 04/30/20 17:24:00 EDT, spasm  . ibuprofen (ADVIL) 800 MG tablet Take 800 mg by mouth 3 (three) times daily.  . Multiple Vitamin (MULTIVITAMIN) tablet Take 1 tablet by mouth daily.  . vitamin B-12 (CYANOCOBALAMIN) 100 MCG tablet  0 Refill(s)   No facility-administered encounter medications on file as of 04/30/2020.     ROS: Pertinent positives and negatives noted in HPI. Remainder of ROS non-contributory   No Known Allergies  Pulse 94   Temp (!) 97.5 F (36.4 C) (Temporal)   Ht 5\' 4"  (1.626 m)   Wt 155 lb 6.4 oz (70.5 kg)   SpO2 98%   BMI 26.67 kg/m   Physical Exam Constitutional:      General: She is not in acute distress.    Appearance: She is not ill-appearing.  Cardiovascular:     Rate and Rhythm: Normal rate and regular rhythm.     Heart sounds: Normal heart sounds.  Pulmonary:     Effort: Pulmonary effort is normal.     Breath sounds: Normal breath sounds. No wheezing or rhonchi.  Chest:     Chest wall: Tenderness present.  Skin:    General: Skin is warm and dry.     Findings: Bruising present.  Neurological:     Mental Status: She is alert and oriented to person, place, and time.      A/P:  1. Contusion of left chest wall, subsequent encounter 2. Closed fracture of nasal bone with routine healing, subsequent encounter 3. Injury due to fall, initial encounter - cyclobenzaprine (FLEXERIL) 5 MG tablet; Take 1 tablet (5 mg total) by mouth at bedtime.  Dispense: 30 tablet; Refill: 0  4. History of colon polyps 5. Screening for colon cancer - Ambulatory referral to Gastroenterology  6. Scalp lesion 7. Seborrheic keratosis - Ambulatory referral to Dermatology  8. Primary osteoarthritis of left knee - Ambulatory referral to Orthopedic Surgery    This visit occurred during the SARS-CoV-2 public  health emergency.  Safety protocols were in place, including screening questions prior to the visit, additional usage of staff PPE, and extensive cleaning of exam room while observing appropriate contact time as indicated for disinfecting solutions.

## 2020-04-30 NOTE — Patient Instructions (Signed)
   [facility] [address], [place], [place] [address] Phone: [phone]  [facility] [address], [place], [place] [address] Phone: [phone]  

## 2020-05-06 ENCOUNTER — Encounter: Payer: Self-pay | Admitting: Family Medicine

## 2020-05-07 ENCOUNTER — Telehealth: Payer: Self-pay | Admitting: Physician Assistant

## 2020-05-07 NOTE — Telephone Encounter (Signed)
Patient is calling to schedule new patient referral from [facility] office.  Patient is scheduled for 08/24/2020 @ 11:30 with Arlyss Gandy, PA-C.

## 2020-05-14 DIAGNOSIS — H10413 Chronic giant papillary conjunctivitis, bilateral: Secondary | ICD-10-CM | POA: Diagnosis not present

## 2020-05-14 DIAGNOSIS — H43811 Vitreous degeneration, right eye: Secondary | ICD-10-CM | POA: Diagnosis not present

## 2020-05-14 DIAGNOSIS — H43392 Other vitreous opacities, left eye: Secondary | ICD-10-CM | POA: Diagnosis not present

## 2020-05-14 DIAGNOSIS — H2513 Age-related nuclear cataract, bilateral: Secondary | ICD-10-CM | POA: Diagnosis not present

## 2020-05-18 ENCOUNTER — Other Ambulatory Visit: Payer: Self-pay

## 2020-05-18 ENCOUNTER — Encounter (INDEPENDENT_AMBULATORY_CARE_PROVIDER_SITE_OTHER): Payer: Medicare Other | Admitting: Ophthalmology

## 2020-05-18 DIAGNOSIS — H2513 Age-related nuclear cataract, bilateral: Secondary | ICD-10-CM

## 2020-05-18 DIAGNOSIS — H33021 Retinal detachment with multiple breaks, right eye: Secondary | ICD-10-CM

## 2020-05-18 DIAGNOSIS — H43813 Vitreous degeneration, bilateral: Secondary | ICD-10-CM | POA: Diagnosis not present

## 2020-05-18 HISTORY — PX: RETINAL TEAR REPAIR CRYOTHERAPY: SHX5304

## 2020-06-01 ENCOUNTER — Other Ambulatory Visit: Payer: Self-pay

## 2020-06-01 ENCOUNTER — Encounter (INDEPENDENT_AMBULATORY_CARE_PROVIDER_SITE_OTHER): Payer: Medicare Other | Admitting: Ophthalmology

## 2020-06-01 DIAGNOSIS — H33301 Unspecified retinal break, right eye: Secondary | ICD-10-CM

## 2020-06-02 ENCOUNTER — Encounter (INDEPENDENT_AMBULATORY_CARE_PROVIDER_SITE_OTHER): Payer: Medicare Other | Admitting: Ophthalmology

## 2020-06-09 ENCOUNTER — Encounter: Payer: Self-pay | Admitting: Gastroenterology

## 2020-06-24 ENCOUNTER — Other Ambulatory Visit: Payer: Self-pay

## 2020-06-24 NOTE — Progress Notes (Signed)
Subjective:   Crystal Blair is a 69 y.o. female who presents for an Initial Medicare Annual Wellness Visit.  Review of Systems        Objective:    Today's Vitals   06/25/20 0903  BP: 110/78  Pulse: 87  Resp: 16  Temp: (!) 97.3 F (36.3 C)  SpO2: 98%  Weight: 153 lb (69.4 kg)  Height: 5\' 4"  (1.626 m)   Body mass index is 26.26 kg/m.  Advanced Directives 06/25/2020  Does Patient Have a Medical Advance Directive? No  Would patient like information on creating a medical advance directive? Yes (MAU/Ambulatory/Procedural Areas - Information given)    Current Medications (verified) Outpatient Encounter Medications as of 06/25/2020  Medication Sig  . Ascorbic Acid (VITAMIN C ADULT GUMMIES PO) Take by mouth.  Marland Kitchen BIOTIN PO Take 3,000 mcg by mouth daily.  . Calcium Carbonate (CALTRATE 600 PO) Take by mouth.  . cyclobenzaprine (FLEXERIL) 5 MG tablet Take 1 tablet (5 mg total) by mouth at bedtime.  Marland Kitchen ibuprofen (ADVIL) 800 MG tablet Take 800 mg by mouth 3 (three) times daily.   . Multiple Vitamin (MULTIVITAMIN) tablet Take 1 tablet by mouth daily.  . vitamin B-12 (CYANOCOBALAMIN) 100 MCG tablet  0 Refill(s)   No facility-administered encounter medications on file as of 06/25/2020.    Allergies (verified) Patient has no known allergies.   History: Past Medical History:  Diagnosis Date  . Arthritis   . History of chicken pox   . Tubular adenoma 2015   Per Colonoscopy/q2yrs   Past Surgical History:  Procedure Laterality Date  . BREAST BIOPSY Left 2015  . BREAST EXCISIONAL BIOPSY Left   . CHOLECYSTECTOMY  1991  . RETINAL TEAR REPAIR CRYOTHERAPY Right 05/18/2020   Family History  Problem Relation Age of Onset  . Arthritis Mother   . COPD Mother   . Depression Mother   . Heart attack Mother   . Alcohol abuse Father   . Early death Father   . Hyperlipidemia Father   . Hypertension Father   . Cancer Sister   . Hypertension Sister    Social History    Socioeconomic History  . Marital status: Widowed    Spouse name: Not on file  . Number of children: Not on file  . Years of education: Not on file  . Highest education level: Not on file  Occupational History  . Occupation: works part time  Tobacco Use  . Smoking status: Never Smoker  . Smokeless tobacco: Never Used  Vaping Use  . Vaping Use: Never used  Substance and Sexual Activity  . Alcohol use: Yes    Comment: occasional  . Drug use: Never  . Sexual activity: Yes  Other Topics Concern  . Not on file  Social History Narrative  . Not on file   Social Determinants of Health   Financial Resource Strain: Low Risk   . Difficulty of Paying Living Expenses: Not hard at all  Food Insecurity: No Food Insecurity  . Worried About Charity fundraiser in the Last Year: Never true  . Ran Out of Food in the Last Year: Never true  Transportation Needs: No Transportation Needs  . Lack of Transportation (Medical): No  . Lack of Transportation (Non-Medical): No  Physical Activity: Inactive  . Days of Exercise per Week: 0 days  . Minutes of Exercise per Session: 0 min  Stress: No Stress Concern Present  . Feeling of Stress : Not at all  Social Connections: Moderately Isolated  . Frequency of Communication with Friends and Family: More than three times a week  . Frequency of Social Gatherings with Friends and Family: Twice a week  . Attends Religious Services: Never  . Active Member of Clubs or Organizations: No  . Attends Archivist Meetings: Never  . Marital Status: Living with partner    Tobacco Counseling Counseling given: Not Answered   Clinical Intake:  Pre-visit preparation completed: Yes  Pain : No/denies pain     Nutritional Status: BMI 25 -29 Overweight Nutritional Risks: None Diabetes: No  How often do you need to have someone help you when you read instructions, pamphlets, or other written materials from your doctor or pharmacy?: 1 - Never What  is the last grade level you completed in school?: Bachelor's degree  Diabetic?No  Interpreter Needed?: No  Information entered by :: Caroleen Hamman LPN   Activities of Daily Living In your present state of health, do you have any difficulty performing the following activities: 06/25/2020  Hearing? N  Vision? N  Difficulty concentrating or making decisions? N  Walking or climbing stairs? N  Dressing or bathing? N  Doing errands, shopping? N  Preparing Food and eating ? N  Using the Toilet? N  In the past six months, have you accidently leaked urine? N  Do you have problems with loss of bowel control? N  Managing your Medications? N  Managing your Finances? N  Housekeeping or managing your Housekeeping? N  Some recent data might be hidden    Patient Care Team: Ronnald Nian, DO as PCP - General (Family Medicine)  Indicate any recent Medical Services you may have received from other than [facility] providers in the past year (date may be approximate).     Assessment:   This is a routine wellness examination for Crystal Blair.  Hearing/Vision screen  Hearing Screening   125Hz  250Hz  500Hz  1000Hz  2000Hz  3000Hz  4000Hz  6000Hz  8000Hz   Right ear:           Left ear:           Comments: No issues  Vision Screening Comments: Wears glasses Last eye exam 04/2020 [facility]  Dietary issues and exercise activities discussed: Current Exercise Habits: The patient does not participate in regular exercise at present, Exercise limited by: None identified  Goals    . Patient Stated     Maintain current health      Depression Screen PHQ 2/9 Scores 06/25/2020 09/11/2019 01/31/2019  PHQ - 2 Score 0 0 1  PHQ- 9 Score - 0 3    Fall Risk Fall Risk  06/25/2020 01/31/2019  Falls in the past year? 1 0  Number falls in past yr: 0 -  Injury with Fall? 1 -  Risk for fall due to : History of fall(s) -  Follow up Falls prevention discussed Falls evaluation completed    Any stairs in or  around the home? No  Home free of loose throw rugs in walkways, pet beds, electrical cords, etc? Yes  Adequate lighting in your home to reduce risk of falls? Yes   ASSISTIVE DEVICES UTILIZED TO PREVENT FALLS:  Life alert? No  Use of a cane, walker or w/c? No  Grab bars in the bathroom? Yes  Shower chair or bench in shower? No  Elevated toilet seat or a handicapped toilet? No   TIMED UP AND GO:  Was the test performed? Yes .  Length of time to ambulate  10 feet: 10 sec.   Gait steady and fast without use of assistive device  Cognitive Function:        Immunizations Immunization History  Administered Date(s) Administered  . Fluad Quad(high Dose 65+) 07/31/2019  . PFIZER SARS-COV-2 Vaccination 01/12/2020, 02/05/2020  . Pneumococcal Conjugate-13 09/11/2019    TDAP status: Due, Education has been provided regarding the importance of this vaccine. Advised may receive this vaccine at local pharmacy or Health Dept. Aware to provide a copy of the vaccination record if obtained from local pharmacy or Health Dept. Verbalized acceptance and understanding.   Flu Vaccine status: Up to date   Pneumococcal vaccine status: Up to date   Covid-19 vaccine status: Completed vaccines  Qualifies for Shingles Vaccine? Yes   Zostavax completed No   Shingrix Completed?: No.    Education has been provided regarding the importance of this vaccine. Patient has been advised to call insurance company to determine out of pocket expense if they have not yet received this vaccine. Advised may also receive vaccine at local pharmacy or Health Dept. Verbalized acceptance and understanding.  Screening Tests Health Maintenance  Topic Date Due  . TETANUS/TDAP  Never done  . COLONOSCOPY  Never done  . INFLUENZA VACCINE  06/21/2020  . PNA vac Low Risk Adult (2 of 2 - PPSV23) 09/10/2020  . MAMMOGRAM  07/22/2021  . DEXA SCAN  Completed  . COVID-19 Vaccine  Completed  . Hepatitis C Screening  Completed     Health Maintenance  Health Maintenance Due  Topic Date Due  . TETANUS/TDAP  Never done  . COLONOSCOPY  Never done  . INFLUENZA VACCINE  06/21/2020    Colorectal cancer screening: Referral to GI placed today. Pt aware the office will call re: appt.   Mammogram status: Completed 07/23/2019. Repeat every year   Bone Density status: Completed 11/18/2019. Results reflect: Bone density results: OSTEOPENIA. Repeat every 2 years.  Lung Cancer Screening: (Low Dose CT Chest recommended if Age 4-80 years, 30 pack-year currently smoking OR have quit w/in 15years.) does not qualify.     Additional Screening:  Hepatitis C Screening: Completed 09/11/2019  Vision Screening: Recommended annual ophthalmology exams for early detection of glaucoma and other disorders of the eye. Is the patient up to date with their annual eye exam?  Yes  Who is the provider or what is the name of the office in which the patient attends annual eye exams? [facility]   Dental Screening: Recommended annual dental exams for proper oral hygiene  Community Resource Referral / Chronic Care Management: CRR required this visit?  No   CCM required this visit?  No      Plan:     I have personally reviewed and noted the following in the patient's chart:   . Medical and social history . Use of alcohol, tobacco or illicit drugs  . Current medications and supplements . Functional ability and status . Nutritional status . Physical activity . Advanced directives . List of other physicians . Hospitalizations, surgeries, and ER visits in previous 12 months . Vitals . Screenings to include cognitive, depression, and falls . Referrals and appointments  In addition, I have reviewed and discussed with patient certain preventive protocols, quality metrics, and best practice recommendations. A written personalized care plan for preventive services as well as general preventive health recommendations were provided  to patient.     Marta Antu, LPN   12/30/5186  Nurse Health Advisor  Nurse Notes: None

## 2020-06-25 ENCOUNTER — Ambulatory Visit (INDEPENDENT_AMBULATORY_CARE_PROVIDER_SITE_OTHER): Payer: Medicare Other

## 2020-06-25 ENCOUNTER — Encounter: Payer: Self-pay | Admitting: Family Medicine

## 2020-06-25 ENCOUNTER — Ambulatory Visit (INDEPENDENT_AMBULATORY_CARE_PROVIDER_SITE_OTHER): Payer: Medicare Other | Admitting: Family Medicine

## 2020-06-25 VITALS — BP 110/78 | HR 87 | Temp 97.3°F | Resp 16 | Ht 64.0 in | Wt 153.0 lb

## 2020-06-25 VITALS — BP 110/78 | HR 87 | Temp 97.3°F | Ht 64.0 in | Wt 153.6 lb

## 2020-06-25 DIAGNOSIS — Z1231 Encounter for screening mammogram for malignant neoplasm of breast: Secondary | ICD-10-CM

## 2020-06-25 DIAGNOSIS — Z0000 Encounter for general adult medical examination without abnormal findings: Secondary | ICD-10-CM

## 2020-06-25 DIAGNOSIS — M1712 Unilateral primary osteoarthritis, left knee: Secondary | ICD-10-CM

## 2020-06-25 NOTE — Patient Instructions (Addendum)
Ortho - [phone] ext. [phone]

## 2020-06-25 NOTE — Patient Instructions (Signed)
Crystal Blair , Thank you for taking time to come for your Medicare Wellness Visit. I appreciate your ongoing commitment to your health goals. Please review the following plan we discussed and let me know if I can assist you in the future.   Screening recommendations/referrals: Colonoscopy: Scheduled for 07/2020 Mammogram: Completed  07/23/2019-Due 07/22/2020 Bone Density: Completed 11/18/2019-Due-11/17/2021 Recommended yearly ophthalmology/optometry visit for glaucoma screening and checkup Recommended yearly dental visit for hygiene and checkup  Vaccinations: Influenza vaccine: Up to Date- Due-07/2020 Pneumococcal vaccine: Up to Date Pneumovax 23 Due 09/10/2020 Tdap vaccine: Discuss with pharmacy Shingles vaccine: Discuss with pharmacy   Covid-19:Completed vaccines  Advanced directives:Discussed. Information given  Conditions/risks identified: See problem list  Next appointment: Follow up in one year for your annual wellness visit 07/01/21 @ 9am   Preventive Care 67 Years and Older, Female Preventive care refers to lifestyle choices and visits with your health care provider that can promote health and wellness. What does preventive care include?  A yearly physical exam. This is also called an annual well check.  Dental exams once or twice a year.  Routine eye exams. Ask your health care provider how often you should have your eyes checked.  Personal lifestyle choices, including:  Daily care of your teeth and gums.  Regular physical activity.  Eating a healthy diet.  Avoiding tobacco and drug use.  Limiting alcohol use.  Practicing safe sex.  Taking low-dose aspirin every day.  Taking vitamin and mineral supplements as recommended by your health care provider. What happens during an annual well check? The services and screenings done by your health care provider during your annual well check will depend on your age, overall health, lifestyle risk factors, and family history  of disease. Counseling  Your health care provider may ask you questions about your:  Alcohol use.  Tobacco use.  Drug use.  Emotional well-being.  Home and relationship well-being.  Sexual activity.  Eating habits.  History of falls.  Memory and ability to understand (cognition).  Work and work Statistician.  Reproductive health. Screening  You may have the following tests or measurements:  Height, weight, and BMI.  Blood pressure.  Lipid and cholesterol levels. These may be checked every 5 years, or more frequently if you are over 17 years old.  Skin check.  Lung cancer screening. You may have this screening every year starting at age 67 if you have a 30-pack-year history of smoking and currently smoke or have quit within the past 15 years.  Fecal occult blood test (FOBT) of the stool. You may have this test every year starting at age 67.  Flexible sigmoidoscopy or colonoscopy. You may have a sigmoidoscopy every 5 years or a colonoscopy every 10 years starting at age 67.  Hepatitis C blood test.  Hepatitis B blood test.  Sexually transmitted disease (STD) testing.  Diabetes screening. This is done by checking your blood sugar (glucose) after you have not eaten for a while (fasting). You may have this done every 1-3 years.  Bone density scan. This is done to screen for osteoporosis. You may have this done starting at age 21.  Mammogram. This may be done every 1-2 years. Talk to your health care provider about how often you should have regular mammograms. Talk with your health care provider about your test results, treatment options, and if necessary, the need for more tests. Vaccines  Your health care provider may recommend certain vaccines, such as:  Influenza vaccine. This is recommended every  year.  Tetanus, diphtheria, and acellular pertussis (Tdap, Td) vaccine. You may need a Td booster every 10 years.  Zoster vaccine. You may need this after age  42.  Pneumococcal 13-valent conjugate (PCV13) vaccine. One dose is recommended after age 54.  Pneumococcal polysaccharide (PPSV23) vaccine. One dose is recommended after age 89. Talk to your health care provider about which screenings and vaccines you need and how often you need them. This information is not intended to replace advice given to you by your health care provider. Make sure you discuss any questions you have with your health care provider. Document Released: 12/04/2015 Document Revised: 07/27/2016 Document Reviewed: 09/08/2015 Elsevier Interactive Patient Education  2017 [employer] Prevention in the Home Falls can cause injuries. They can happen to people of all ages. There are many things you can do to make your home safe and to help prevent falls. What can I do on the outside of my home?  Regularly fix the edges of walkways and driveways and fix any cracks.  Remove anything that might make you trip as you walk through a door, such as a raised step or threshold.  Trim any bushes or trees on the path to your home.  Use bright outdoor lighting.  Clear any walking paths of anything that might make someone trip, such as rocks or tools.  Regularly check to see if handrails are loose or broken. Make sure that both sides of any steps have handrails.  Any raised decks and porches should have guardrails on the edges.  Have any leaves, snow, or ice cleared regularly.  Use sand or salt on walking paths during winter.  Clean up any spills in your garage right away. This includes oil or grease spills. What can I do in the bathroom?  Use night lights.  Install grab bars by the toilet and in the tub and shower. Do not use towel bars as grab bars.  Use non-skid mats or decals in the tub or shower.  If you need to sit down in the shower, use a plastic, non-slip stool.  Keep the floor dry. Clean up any water that spills on the floor as soon as it happens.  Remove  soap buildup in the tub or shower regularly.  Attach bath mats securely with double-sided non-slip rug tape.  Do not have throw rugs and other things on the floor that can make you trip. What can I do in the bedroom?  Use night lights.  Make sure that you have a light by your bed that is easy to reach.  Do not use any sheets or blankets that are too big for your bed. They should not hang down onto the floor.  Have a firm chair that has side arms. You can use this for support while you get dressed.  Do not have throw rugs and other things on the floor that can make you trip. What can I do in the kitchen?  Clean up any spills right away.  Avoid walking on wet floors.  Keep items that you use a lot in easy-to-reach places.  If you need to reach something above you, use a strong step stool that has a grab bar.  Keep electrical cords out of the way.  Do not use floor polish or wax that makes floors slippery. If you must use wax, use non-skid floor wax.  Do not have throw rugs and other things on the floor that can make you trip. What can  I do with my stairs?  Do not leave any items on the stairs.  Make sure that there are handrails on both sides of the stairs and use them. Fix handrails that are broken or loose. Make sure that handrails are as long as the stairways.  Check any carpeting to make sure that it is firmly attached to the stairs. Fix any carpet that is loose or worn.  Avoid having throw rugs at the top or bottom of the stairs. If you do have throw rugs, attach them to the floor with carpet tape.  Make sure that you have a light switch at the top of the stairs and the bottom of the stairs. If you do not have them, ask someone to add them for you. What else can I do to help prevent falls?  Wear shoes that:  Do not have high heels.  Have rubber bottoms.  Are comfortable and fit you well.  Are closed at the toe. Do not wear sandals.  If you use a  stepladder:  Make sure that it is fully opened. Do not climb a closed stepladder.  Make sure that both sides of the stepladder are locked into place.  Ask someone to hold it for you, if possible.  Clearly mark and make sure that you can see:  Any grab bars or handrails.  First and last steps.  Where the edge of each step is.  Use tools that help you move around (mobility aids) if they are needed. These include:  Canes.  Walkers.  Scooters.  Crutches.  Turn on the lights when you go into a dark area. Replace any light bulbs as soon as they burn out.  Set up your furniture so you have a clear path. Avoid moving your furniture around.  If any of your floors are uneven, fix them.  If there are any pets around you, be aware of where they are.  Review your medicines with your doctor. Some medicines can make you feel dizzy. This can increase your chance of falling. Ask your doctor what other things that you can do to help prevent falls. This information is not intended to replace advice given to you by your health care provider. Make sure you discuss any questions you have with your health care provider. Document Released: 09/03/2009 Document Revised: 04/14/2016 Document Reviewed: 12/12/2014 Elsevier Interactive Patient Education  2017 [employer].

## 2020-06-25 NOTE — Progress Notes (Signed)
Crystal Blair is a 69 y.o. female  Chief Complaint  Patient presents with   Establish Care    Pt here forTOC    HPI: Crystal Blair is a 69 y.o. female here for [facility] appt, previous PCP Dr. Deborra Medina. I saw pt for acute visit on 04/30/20 in f/u from ER visit after a fall on an uneven sidewalk. At last OV, I had placed referral to ortho for chronic Lt knee pain, h/o OA. Pt does not have that scheduled.  She has appt with GI on 9/7, derm on 10/4, and ophthalmology on 10/06/20. Last mammo 07/2019 - pt will schedule Last dexa - 10/2019 - T-score -1.8 Last PAP - 08/2019 Last labs - 08/2019  Past Medical History:  Diagnosis Date   Arthritis    History of chicken pox    Tubular adenoma 2015   Per Colonoscopy/q72yrs    Past Surgical History:  Procedure Laterality Date   BREAST BIOPSY Left 2015   BREAST EXCISIONAL BIOPSY Left    CHOLECYSTECTOMY  1991   RETINAL TEAR REPAIR CRYOTHERAPY Right 05/18/2020    Social History   Socioeconomic History   Marital status: Widowed    Spouse name: Not on file   Number of children: Not on file   Years of education: Not on file   Highest education level: Not on file  Occupational History   Not on file  Tobacco Use   Smoking status: Never Smoker   Smokeless tobacco: Never Used  Vaping Use   Vaping Use: Never used  Substance and Sexual Activity   Alcohol use: Yes    Comment: ccas   Drug use: Never   Sexual activity: Yes  Other Topics Concern   Not on file  Social History Narrative   Not on file   Social Determinants of Health   Financial Resource Strain:    Difficulty of Paying Living Expenses:   Food Insecurity:    Worried About Charity fundraiser in the Last Year:    Arboriculturist in the Last Year:   Transportation Needs:    Film/video editor (Medical):    Lack of Transportation (Non-Medical):   Physical Activity:    Days of Exercise per Week:    Minutes of Exercise per Session:    Stress:    Feeling of Stress :   Social Connections:    Frequency of Communication with Friends and Family:    Frequency of Social Gatherings with Friends and Family:    Attends Religious Services:    Active Member of Clubs or Organizations:    Attends Music therapist:    Marital Status:   Intimate Partner Violence:    Fear of Current or Ex-Partner:    Emotionally Abused:    Physically Abused:    Sexually Abused:     Family History  Problem Relation Age of Onset   Arthritis Mother    COPD Mother    Depression Mother    Heart attack Mother    Alcohol abuse Father    Early death Father    Hyperlipidemia Father    Hypertension Father    Cancer Sister    Hypertension Sister      Immunization History  Administered Date(s) Administered   Fluad Quad(high Dose 65+) 07/31/2019   PFIZER SARS-COV-2 Vaccination 01/12/2020, 02/05/2020   Pneumococcal Conjugate-13 09/11/2019    Outpatient Encounter Medications as of 06/25/2020  Medication Sig   Ascorbic Acid (VITAMIN C ADULT GUMMIES  PO) Take by mouth.   BIOTIN PO Take 3,000 mcg by mouth daily.   Calcium Carbonate (CALTRATE 600 PO) Take by mouth.   Multiple Vitamin (MULTIVITAMIN) tablet Take 1 tablet by mouth daily.   vitamin B-12 (CYANOCOBALAMIN) 100 MCG tablet  0 Refill(s)   cyclobenzaprine (FLEXERIL) 5 MG tablet Take 1 tablet (5 mg total) by mouth at bedtime. (Patient not taking: Reported on 06/25/2020)   ibuprofen (ADVIL) 800 MG tablet Take 800 mg by mouth 3 (three) times daily. (Patient not taking: Reported on 06/25/2020)   No facility-administered encounter medications on file as of 06/25/2020.     ROS: Pertinent positives and negatives noted in HPI. Remainder of ROS non-contributory   No Known Allergies  BP 110/78 (BP Location: Left Arm, Patient Position: Sitting, Cuff Size: Normal)    Pulse 87    Temp (!) 97.3 F (36.3 C) (Temporal)    Ht 5\' 4"  (1.626 m)    Wt 153 lb 9.6 oz  (69.7 kg)    SpO2 98%    BMI 26.37 kg/m   Physical Exam Vitals reviewed.  Constitutional:      General: She is not in acute distress.    Appearance: Normal appearance. She is not ill-appearing.  Pulmonary:     Effort: No respiratory distress.  Neurological:     Mental Status: She is alert and oriented to person, place, and time.  Psychiatric:        Mood and Affect: Mood normal.        Behavior: Behavior normal.      A/P:  1. Encounter for screening mammogram for malignant neoplasm of breast - MM DIGITAL SCREENING BILATERAL; Future  2. Primary osteoarthritis of left knee - referral had been placed but pt needs to return call to schedule  3. Healthcare maintenance - GI appt scheduled - PAP, Dexa, labs UTD - mammo due in 07/2020 and referral placed today   This visit occurred during the SARS-CoV-2 public health emergency.  Safety protocols were in place, including screening questions prior to the visit, additional usage of staff PPE, and extensive cleaning of exam room while observing appropriate contact time as indicated for disinfecting solutions.

## 2020-07-02 ENCOUNTER — Encounter: Payer: Self-pay | Admitting: Family Medicine

## 2020-07-28 ENCOUNTER — Encounter: Payer: Self-pay | Admitting: Gastroenterology

## 2020-07-28 ENCOUNTER — Ambulatory Visit: Payer: Medicare Other | Admitting: Gastroenterology

## 2020-07-28 VITALS — BP 122/70 | HR 82 | Ht 64.5 in | Wt 154.4 lb

## 2020-07-28 DIAGNOSIS — Z86010 Personal history of colonic polyps: Secondary | ICD-10-CM

## 2020-07-28 DIAGNOSIS — K921 Melena: Secondary | ICD-10-CM

## 2020-07-28 NOTE — Patient Instructions (Addendum)
If you are age 67 or older, your body mass index should be between 23-30. Your Body mass index is 26.09 kg/m. If this is out of the aforementioned range listed, please consider follow up with your Primary Care Provider.  If you are age 49 or younger, your body mass index should be between 19-25. Your Body mass index is 26.09 kg/m. If this is out of the aformentioned range listed, please consider follow up with your Primary Care Provider.   You have been scheduled for a colonoscopy. Please follow written instructions given to you at your visit today.   If you use inhalers (even only as needed), please bring them with you on the day of your procedure.   You have been given a Clenpiq sample today.    It was a pleasure to see you today!  Vito Cirigliano, D.O.

## 2020-07-28 NOTE — Progress Notes (Signed)
Chief Complaint: Discuss colonoscopy, polyp surveillance,   Referring Provider:     Ronnald Nian, DO   HPI:    Crystal Blair is a 69 y.o. female referred to the [facility] for evaluation of repeat colonoscopy for ongoing polyp surveillance.  Last colonoscopy was performed outside facility in [place] approximately 2016 and notable for tubular adenoma.  Recommended repeat in 5 years. Sent request for records today.   Started colonoscopies approx age 30, notable for polyps at that time, and has subsequently had frequent surveillance colonoscopy, with a couple of polyps since then.  Also hx of internal hemorrhoids and diverticulosis.   Separately, does have a longstanding history of intermittent, episodic BRBPR. This has been present for many years, and overall unchanged. Can be related to stress. No abdominal pain, f/c/n/v/d/c.  Family history notable for maternal aunt with CRC > age 46 and son with Crohn's Disease.   Past Medical History:  Diagnosis Date  . Arthritis   . Diverticulosis   . History of chicken pox   . History of gallstones   . Tubular adenoma 2015   Per Colonoscopy/q62yrs     Past Surgical History:  Procedure Laterality Date  . BREAST BIOPSY Left 2015  . BREAST EXCISIONAL BIOPSY Left   . CHOLECYSTECTOMY  1991  . COLONOSCOPY     around 2016  . ESOPHAGOGASTRODUODENOSCOPY     around 1993. Dr Roel Cluck.   Marland Kitchen RETINAL TEAR REPAIR CRYOTHERAPY Right 05/18/2020   Family History  Problem Relation Age of Onset  . Arthritis Mother   . COPD Mother   . Depression Mother   . Heart attack Mother   . Alcohol abuse Father   . Early death Father   . Hyperlipidemia Father   . Hypertension Father   . Hypertension Sister   . Bladder Cancer Sister   . Crohn's disease Son   . Colon cancer Maternal Aunt   . Esophageal cancer Neg Hx    Social History   Tobacco Use  . Smoking status: Never Smoker  . Smokeless tobacco: Never Used  Vaping  Use  . Vaping Use: Never used  Substance Use Topics  . Alcohol use: Yes    Comment: occasional  . Drug use: Never   Current Outpatient Medications  Medication Sig Dispense Refill  . Ascorbic Acid (VITAMIN C ADULT GUMMIES PO) Take by mouth.    Marland Kitchen BIOTIN PO Take 3,000 mcg by mouth daily.    . Calcium Carbonate (CALTRATE 600 PO) Take by mouth.    Marland Kitchen ibuprofen (ADVIL) 200 MG tablet Take 200 mg by mouth as needed.    . Multiple Vitamin (MULTIVITAMIN) tablet Take 2 tablets by mouth daily. Take 2 gummies daily    . vitamin B-12 (CYANOCOBALAMIN) 100 MCG tablet  0 Refill(s)    . cyclobenzaprine (FLEXERIL) 5 MG tablet Take 1 tablet (5 mg total) by mouth at bedtime. (Patient not taking: Reported on 07/28/2020) 30 tablet 0   No current facility-administered medications for this visit.   Allergies  Allergen Reactions  . Penicillin G Other (See Comments)    As a kid had a reaction   . Mango Flavor      Review of Systems: All systems reviewed and negative except where noted in HPI.     Physical Exam:    Wt Readings from Last 3 Encounters:  07/28/20 154 lb 6 oz (70 kg)  06/25/20 153 lb (69.4  kg)  06/25/20 153 lb 9.6 oz (69.7 kg)    BP 122/70   Pulse 82   Ht 5' 4.5" (1.638 m)   Wt 154 lb 6 oz (70 kg)   BMI 26.09 kg/m  Constitutional:  Pleasant, in no acute distress. Psychiatric: Normal mood and affect. Behavior is normal. EENT: Pupils normal.  Conjunctivae are normal. No scleral icterus. Neck supple. No cervical LAD. Cardiovascular: Normal rate, regular rhythm. No edema Pulmonary/chest: Effort normal and breath sounds normal. No wheezing, rales or rhonchi. Abdominal: Soft, nondistended, nontender. Bowel sounds active throughout. There are no masses palpable. No hepatomegaly. Neurological: Alert and oriented to person place and time. Skin: Skin is warm and dry. No rashes noted. Rectal: Exam deferred by patient to time of colonoscopy.    ASSESSMENT AND PLAN;   1) History of  colon tubular adenoma -Due for repeat colonoscopy for ongoing polyp surveillance -Schedule colonoscopy -Obtain records from previous Gastroenterologist  2) Hematochezia -Suspect benign anorectal disease based on clinical presentation and long duration of symptoms. -Can evaluate radiology at time of colonoscopy as above -If clinically significant hemorrhoids, can consider hemorrhoid banding   The indications, risks, and benefits of colonoscopy were explained to the patient in detail. Risks include but are not limited to bleeding, perforation, adverse reaction to medications, and cardiopulmonary compromise. Sequelae include but are not limited to the possibility of surgery, hospitalization, and mortality. The patient verbalized understanding and wished to proceed. All questions answered, referred to the scheduler and bowel prep ordered. Further recommendations pending results of the exam.      Lavena Bullion, DO, FACG  07/28/2020, 1:44 PM   Islay Polanco, Garvin Fila, DO

## 2020-08-05 ENCOUNTER — Encounter: Payer: Self-pay | Admitting: Certified Registered Nurse Anesthetist

## 2020-08-06 ENCOUNTER — Ambulatory Visit (AMBULATORY_SURGERY_CENTER): Payer: Medicare Other | Admitting: Gastroenterology

## 2020-08-06 ENCOUNTER — Encounter: Payer: Self-pay | Admitting: Gastroenterology

## 2020-08-06 ENCOUNTER — Other Ambulatory Visit: Payer: Self-pay

## 2020-08-06 VITALS — BP 133/80 | HR 84 | Temp 97.5°F | Resp 10 | Ht 64.5 in | Wt 154.0 lb

## 2020-08-06 DIAGNOSIS — D123 Benign neoplasm of transverse colon: Secondary | ICD-10-CM

## 2020-08-06 DIAGNOSIS — K921 Melena: Secondary | ICD-10-CM

## 2020-08-06 DIAGNOSIS — K641 Second degree hemorrhoids: Secondary | ICD-10-CM

## 2020-08-06 DIAGNOSIS — K5730 Diverticulosis of large intestine without perforation or abscess without bleeding: Secondary | ICD-10-CM | POA: Diagnosis not present

## 2020-08-06 DIAGNOSIS — K635 Polyp of colon: Secondary | ICD-10-CM | POA: Diagnosis not present

## 2020-08-06 DIAGNOSIS — K5520 Angiodysplasia of colon without hemorrhage: Secondary | ICD-10-CM | POA: Diagnosis not present

## 2020-08-06 DIAGNOSIS — Z86010 Personal history of colonic polyps: Secondary | ICD-10-CM

## 2020-08-06 DIAGNOSIS — D122 Benign neoplasm of ascending colon: Secondary | ICD-10-CM

## 2020-08-06 MED ORDER — SODIUM CHLORIDE 0.9 % IV SOLN: 500.0000 mL | Freq: Once | INTRAVENOUS | Status: DC

## 2020-08-06 NOTE — Progress Notes (Signed)
Patient experiencing nausea and vomiting.  MD updated and Zofran 4 mg IV given, vss  

## 2020-08-06 NOTE — Progress Notes (Signed)
Report given to PACU, vss 

## 2020-08-06 NOTE — Patient Instructions (Signed)
YOU HAD AN ENDOSCOPIC PROCEDURE TODAY AT THE [facility]:   Refer to the procedure report that was given to you for any specific questions about what was found during the examination.  If the procedure report does not answer your questions, please call your gastroenterologist to clarify.  If you requested that your care partner not be given the details of your procedure findings, then the procedure report has been included in a sealed envelope for you to review at your convenience later.  YOU SHOULD EXPECT: Some feelings of bloating in the abdomen. Passage of more gas than usual.  Walking can help get rid of the air that was put into your GI tract during the procedure and reduce the bloating. If you had a lower endoscopy (such as a colonoscopy or flexible sigmoidoscopy) you may notice spotting of blood in your stool or on the toilet paper. If you underwent a bowel prep for your procedure, you may not have a normal bowel movement for a few days.  Please Note:  You might notice some irritation and congestion in your nose or some drainage.  This is from the oxygen used during your procedure.  There is no need for concern and it should clear up in a day or so.  SYMPTOMS TO REPORT IMMEDIATELY:   Following lower endoscopy (colonoscopy or flexible sigmoidoscopy):  Excessive amounts of blood in the stool  Significant tenderness or worsening of abdominal pains  Swelling of the abdomen that is new, acute  Fever of 100F or higher  For urgent or emergent issues, a gastroenterologist can be reached at any hour by calling [phone]. Do not use MyChart messaging for urgent concerns.    DIET:  We do recommend a small meal at first, but then you may proceed to your regular diet.  Drink plenty of fluids but you should avoid alcoholic beverages for 24 hours.  ACTIVITY:  You should plan to take it easy for the rest of today and you should NOT DRIVE or use heavy machinery until tomorrow (because  of the sedation medicines used during the test).    FOLLOW UP: Our staff will call the number listed on your records 48-72 hours following your procedure to check on you and address any questions or concerns that you may have regarding the information given to you following your procedure. If we do not reach you, we will leave a message.  We will attempt to reach you two times.  During this call, we will ask if you have developed any symptoms of COVID 19. If you develop any symptoms (ie: fever, flu-like symptoms, shortness of breath, cough etc.) before then, please call [phone].  If you test positive for Covid 19 in the 2 weeks post procedure, please call and report this information to us.    If any biopsies were taken you will be contacted by phone or by letter within the next 1-3 weeks.  Please call us at [phone] if you have not heard about the biopsies in 3 weeks.    SIGNATURES/CONFIDENTIALITY: You and/or your care partner have signed paperwork which will be entered into your electronic medical record.  These signatures attest to the fact that that the information above on your After Visit Summary has been reviewed and is understood.  Full responsibility of the confidentiality of this discharge information lies with you and/or your care-partner. 

## 2020-08-06 NOTE — Progress Notes (Signed)
Vitals-JK  History reviewed.

## 2020-08-06 NOTE — Op Note (Signed)
[facility] Patient Name: Crystal Blair Procedure Date: 08/06/2020 1:29 PM MRN: [id number] Endoscopist: Gerrit Heck , MD Age: 69 Referring MD:  Date of Birth: 1950/12/08 Gender: Female Account #: [id number] Procedure:                Colonoscopy Indications:              Surveillance: Personal history of adenomatous                            polyps on last colonoscopy 5 years ago during                            colonoscopy in [place], with recommendation to repeat in                            5 years for ongoing surveillance.                           Separately, she has a history of intermittent                            hematochezia and Fhx notable for son with Crohns                            Disease. Medicines:                Monitored Anesthesia Care Procedure:                Pre-Anesthesia Assessment:                           - Prior to the procedure, a History and Physical                            was performed, and patient medications and                            allergies were reviewed. The patient's tolerance of                            previous anesthesia was also reviewed. The risks                            and benefits of the procedure and the sedation                            options and risks were discussed with the patient.                            All questions were answered, and informed consent                            was obtained. Prior Anticoagulants: The patient has  taken no previous anticoagulant or antiplatelet                            agents. ASA Grade Assessment: II - A patient with                            mild systemic disease. After reviewing the risks                            and benefits, the patient was deemed in                            satisfactory condition to undergo the procedure.                           After obtaining informed consent, the colonoscope                             was passed under direct vision. Throughout the                            procedure, the patient's blood pressure, pulse, and                            oxygen saturations were monitored continuously. The                            Colonoscope was introduced through the anus and                            advanced to the the terminal ileum. The colonoscopy                            was performed without difficulty. The patient                            tolerated the procedure well. The quality of the                            bowel preparation was adequate. The terminal ileum,                            ileocecal valve, appendiceal orifice, and rectum                            were photographed. Scope In: 1:42:36 PM Scope Out: 2:08:15 PM Scope Withdrawal Time: 0 hours 19 minutes 33 seconds  Total Procedure Duration: 0 hours 25 minutes 39 seconds  Findings:                 Hemorrhoids were found on perianal exam.                           Two sessile polyps were found in the transverse  colon and ascending colon. The polyps were 4 to 6                            mm in size. These polyps were removed with a cold                            snare. Resection and retrieval were complete.                            Estimated blood loss was minimal.                           A single angioectasia without bleeding was found in                            the cecum.                           Localized moderate inflammation characterized by                            congestion (edema) and erythema was found in the                            sigmoid colon, located 20-30 cm from the anal                            verge. This was located in an area of dense                            diverticulosis. Biopsies were taken with a cold                            forceps for histology. Estimated blood loss was                            minimal.                            Multiple small and large-mouthed diverticula were                            found in the sigmoid colon and ascending colon.                           Non-bleeding internal hemorrhoids were found during                            retroflexion. The hemorrhoids were small and Grade                            II (internal hemorrhoids that prolapse but reduce  spontaneously).                           The terminal ileum appeared normal. Complications:            No immediate complications. Estimated Blood Loss:     Estimated blood loss was minimal. Impression:               - Hemorrhoids found on perianal exam.                           - Two 4 to 6 mm polyps in the transverse colon and                            in the ascending colon, removed with a cold snare.                            Resected and retrieved.                           - A single non-bleeding colonic angioectasia.                           - Localized moderate inflammation was found in the                            sigmoid colon. Biopsied.                           - Diverticulosis in the sigmoid colon and in the                            ascending colon.                           - Non-bleeding internal hemorrhoids.                           - The examined portion of the ileum was normal. Recommendation:           - Patient has a contact number available for                            emergencies. The signs and symptoms of potential                            delayed complications were discussed with the                            patient. Return to normal activities tomorrow.                            Written discharge instructions were provided to the                            patient.                           -  Resume previous diet.                           - Continue present medications.                           - Await pathology results.                           - Repeat colonoscopy  for surveillance based on                            pathology results.                           - Return to GI office at appointment to be                            scheduled.                           - Use fiber, for example Citrucel, Fibercon, Konsyl                            or Metamucil.                           - Internal hemorrhoids were noted on this study and                            may be amenable to hemorrhoid band ligation. If you                            are interested in further treatment of these                            hemorrhoids with band ligation, please contact my                            clinic to set up an appointment for evaluation and                            treatment. Gerrit Heck, MD 08/06/2020 2:15:30 PM

## 2020-08-06 NOTE — Progress Notes (Signed)
Called to room to assist during endoscopic procedure.  Patient ID and intended procedure confirmed with present staff. Received instructions for my participation in the procedure from the performing physician.  

## 2020-08-06 NOTE — Progress Notes (Signed)
1400 Robinul 0.2 mg IV given due large amount of secretions upon assessment.  MD made aware, vss

## 2020-08-10 ENCOUNTER — Telehealth: Payer: Self-pay | Admitting: *Deleted

## 2020-08-10 NOTE — Telephone Encounter (Signed)
°  Follow up Call-  Call back number 08/06/2020  Post procedure Call Back phone  # [phone]  Permission to leave phone message Yes  Some recent data might be hidden     Patient questions:  Do you have a fever, pain , or abdominal swelling? No. Pain Score  0 *  Have you tolerated food without any problems? Yes.    Have you been able to return to your normal activities? Yes.    Do you have any questions about your discharge instructions: Diet   No. Medications  No. Follow up visit  No.  Do you have questions or concerns about your Care? Yes.    Actions: * If pain score is 4 or above: No action needed, pain <4.  1. Have you developed a fever since your procedure? no  2.   Have you had an respiratory symptoms (SOB or cough) since your procedure? no  3.   Have you tested positive for COVID 19 since your procedure no  4.   Have you had any family members/close contacts diagnosed with the COVID 19 since your procedure? no   If yes to any of these questions please route to Joylene John, RN and Joella Prince, RN

## 2020-08-14 ENCOUNTER — Other Ambulatory Visit: Payer: Self-pay

## 2020-08-14 DIAGNOSIS — K529 Noninfective gastroenteritis and colitis, unspecified: Secondary | ICD-10-CM

## 2020-08-14 MED ORDER — MESALAMINE ER 0.375 G PO CP24: ORAL_CAPSULE | ORAL | 5 refills | Status: DC

## 2020-08-24 ENCOUNTER — Other Ambulatory Visit: Payer: Self-pay

## 2020-08-24 ENCOUNTER — Ambulatory Visit (INDEPENDENT_AMBULATORY_CARE_PROVIDER_SITE_OTHER): Payer: Medicare Other | Admitting: Physician Assistant

## 2020-08-24 DIAGNOSIS — L603 Nail dystrophy: Secondary | ICD-10-CM | POA: Diagnosis not present

## 2020-08-24 DIAGNOSIS — L309 Dermatitis, unspecified: Secondary | ICD-10-CM

## 2020-08-24 DIAGNOSIS — Z1283 Encounter for screening for malignant neoplasm of skin: Secondary | ICD-10-CM

## 2020-08-24 DIAGNOSIS — L820 Inflamed seborrheic keratosis: Secondary | ICD-10-CM | POA: Diagnosis not present

## 2020-08-24 DIAGNOSIS — L570 Actinic keratosis: Secondary | ICD-10-CM

## 2020-08-24 DIAGNOSIS — L821 Other seborrheic keratosis: Secondary | ICD-10-CM

## 2020-08-24 MED ORDER — FLUOROURACIL 5 % EX CREA: TOPICAL_CREAM | Freq: Every day | CUTANEOUS | 0 refills | Status: DC

## 2020-08-24 MED ORDER — BETAMETHASONE DIPROPIONATE 0.05 % EX CREA: TOPICAL_CREAM | Freq: Two times a day (BID) | CUTANEOUS | 3 refills | Status: DC | PRN

## 2020-08-24 NOTE — Progress Notes (Signed)
   New Patient Visit  Subjective  Crystal Blair is a 69 y.o. female who presents for the following: Skin Problem (Face has red dry patches around scalp area. Tip of nose has some red pink dots that come and go. Lip also gets recurrung red dot. Left cheek dry patches. Dark area left jawline. right outer leg crusty spot.  right toe has a spot on it. ). Right toenail seems to be deformed. Rash on hands that has been going on an off for years. Came back last July and is in between fingers and itches.   Objective  Well appearing patient in no apparent distress; mood and affect are within normal limits.  A full examination was performed including scalp, head, eyes, ears, nose, lips, neck, chest, axillae, abdomen, back, buttocks, bilateral upper extremities, bilateral lower extremities, hands, feet, fingers, toes, fingernails, and toenails. All findings within normal limits unless otherwise noted below. No suspicious moles noted on back.  Objective  Mid Back: Full body skin exam  Objective  Right Hallux Toe Nail Plate: There is a concave veritcal line on right great nail. This seems to stop 2 mm out from the cuticle. She says the piece that is growing out was attached to her cuticle and she detached it about 1-2 months ago and she has seen it grow out since then.   Images    Objective  Left Dorsal Hand, Right Dorsal Hand: Thin scaly erythematous papules coalescing to plaques. Dorsum of hands between fingers. Most likely irritant hand dermatitis.  Objective  Left Forehead: Erythematous stuck-on, waxy papule or plaque.   Objective  Left Zygomatic Area, Mid Forehead, Right Ala Nasi: Erythematous patches with gritty scale.  Objective  Right Lower Leg - Posterior, Right Upper Back: Stuck-on, waxy papules and plaques.   Assessment & Plan  Screening for malignant neoplasm of skin Mid Back  Nail dystrophy Right Hallux Toe Nail Plate  Photo and measurement taken. There is 2 mm  between the proximal end of this line and the cuticle. She is to continue to watch this and make sure it is growing out. Explained 9-12 months to grow an entirely new toenail.  Dermatitis (2) Left Dorsal Hand; Right Dorsal Hand  betamethasone dipropionate 0.05 % cream - Left Dorsal Hand, Right Dorsal Hand  Inflamed seborrheic keratosis Left Forehead  Destruction of lesion - Left Forehead Complexity: simple   Destruction method: cryotherapy   Informed consent: discussed and consent obtained   Timeout:  patient name, date of birth, surgical site, and procedure verified Lesion destroyed using liquid nitrogen: Yes   Cryotherapy cycles:  1 Outcome: patient tolerated procedure well with no complications   Post-procedure details: wound care instructions given    AK (actinic keratosis) (3) Mid Forehead; Right Ala Nasi; Left Zygomatic Area  She will apply 5FU to the right nose, left cheek and upper right forehead nightly for 1 week and then off for 2 weeks and then nightly again for 1 week. I warned of looking worse before better.  fluorouracil (EFUDEX) 5 % cream - Left Zygomatic Area, Mid Forehead, Right Ala Nasi  Seborrheic keratosis (2) Right Lower Leg - Posterior; Right Upper Back  No treatment

## 2020-08-25 ENCOUNTER — Encounter: Payer: Self-pay | Admitting: Physician Assistant

## 2020-08-31 ENCOUNTER — Other Ambulatory Visit (INDEPENDENT_AMBULATORY_CARE_PROVIDER_SITE_OTHER): Payer: Medicare Other

## 2020-08-31 DIAGNOSIS — K529 Noninfective gastroenteritis and colitis, unspecified: Secondary | ICD-10-CM | POA: Diagnosis not present

## 2020-08-31 LAB — BASIC METABOLIC PANEL
BUN: 13 mg/dL (ref 6–23)
CO2: 28 mEq/L (ref 19–32)
Calcium: 9.5 mg/dL (ref 8.4–10.5)
Chloride: 102 mEq/L (ref 96–112)
Creatinine, Ser: 0.67 mg/dL (ref 0.40–1.20)
GFR: 91.07 mL/min (ref >60.00)
Glucose, Bld: 78 mg/dL (ref 70–99)
Potassium: 3.7 mEq/L (ref 3.5–5.1)
Sodium: 137 mEq/L (ref 135–145)

## 2020-09-01 ENCOUNTER — Other Ambulatory Visit: Payer: Self-pay | Admitting: Gastroenterology

## 2020-09-01 ENCOUNTER — Ambulatory Visit: Payer: Medicare Other | Attending: Internal Medicine

## 2020-09-01 DIAGNOSIS — Z23 Encounter for immunization: Secondary | ICD-10-CM

## 2020-09-01 DIAGNOSIS — D122 Benign neoplasm of ascending colon: Secondary | ICD-10-CM

## 2020-09-01 NOTE — Progress Notes (Signed)
   Covid-19 Vaccination Clinic  Name:  Crystal Blair    MRN: [id number] DOB: 1951-02-14  09/01/2020  [name] was observed post Covid-19 immunization for 15 minutes without incident. She was provided with Vaccine Information Sheet and instruction to access the V-Safe system.   [name] was instructed to call 911 with any severe reactions post vaccine: Marland Kitchen Difficulty breathing  . Swelling of face and throat  . A fast heartbeat  . A bad rash all over body  . Dizziness and weakness

## 2020-09-02 ENCOUNTER — Telehealth: Payer: Self-pay | Admitting: Gastroenterology

## 2020-09-18 ENCOUNTER — Ambulatory Visit: Payer: Medicare Other

## 2020-10-06 ENCOUNTER — Other Ambulatory Visit: Payer: Self-pay

## 2020-10-06 ENCOUNTER — Encounter (INDEPENDENT_AMBULATORY_CARE_PROVIDER_SITE_OTHER): Payer: Medicare Other | Admitting: Ophthalmology

## 2020-10-06 DIAGNOSIS — H43813 Vitreous degeneration, bilateral: Secondary | ICD-10-CM

## 2020-10-06 DIAGNOSIS — H33301 Unspecified retinal break, right eye: Secondary | ICD-10-CM

## 2020-10-19 ENCOUNTER — Other Ambulatory Visit (INDEPENDENT_AMBULATORY_CARE_PROVIDER_SITE_OTHER): Payer: Medicare Other

## 2020-10-19 DIAGNOSIS — K529 Noninfective gastroenteritis and colitis, unspecified: Secondary | ICD-10-CM | POA: Diagnosis not present

## 2020-10-19 LAB — BASIC METABOLIC PANEL
BUN: 10 mg/dL (ref 6–23)
CO2: 30 mEq/L (ref 19–32)
Calcium: 9.8 mg/dL (ref 8.4–10.5)
Chloride: 102 mEq/L (ref 96–112)
Creatinine, Ser: 0.61 mg/dL (ref 0.40–1.20)
GFR: 92.81 mL/min (ref >60.00)
Glucose, Bld: 99 mg/dL (ref 70–99)
Potassium: 4.7 mEq/L (ref 3.5–5.1)
Sodium: 139 mEq/L (ref 135–145)

## 2020-10-20 ENCOUNTER — Telehealth: Payer: Self-pay | Admitting: General Surgery

## 2020-10-20 DIAGNOSIS — K529 Noninfective gastroenteritis and colitis, unspecified: Secondary | ICD-10-CM

## 2020-10-20 NOTE — Telephone Encounter (Signed)
Left a detailed message on the patients phone that all labs were within normal limits, he would like to repeat a BMET in 3-4 months, order already placed in computer for patient as part of her treatment plan.

## 2020-10-21 ENCOUNTER — Ambulatory Visit (INDEPENDENT_AMBULATORY_CARE_PROVIDER_SITE_OTHER): Payer: Medicare Other | Admitting: Gastroenterology

## 2020-10-21 ENCOUNTER — Encounter: Payer: Self-pay | Admitting: Family Medicine

## 2020-10-21 ENCOUNTER — Other Ambulatory Visit: Payer: Self-pay

## 2020-10-21 ENCOUNTER — Encounter: Payer: Self-pay | Admitting: Gastroenterology

## 2020-10-21 VITALS — BP 130/74 | HR 80 | Ht 64.0 in | Wt 152.0 lb

## 2020-10-21 DIAGNOSIS — K5010 Crohn's disease of large intestine without complications: Secondary | ICD-10-CM

## 2020-10-21 DIAGNOSIS — R1013 Epigastric pain: Secondary | ICD-10-CM

## 2020-10-21 DIAGNOSIS — K649 Unspecified hemorrhoids: Secondary | ICD-10-CM

## 2020-10-21 DIAGNOSIS — K921 Melena: Secondary | ICD-10-CM

## 2020-10-21 DIAGNOSIS — Z86010 Personal history of colonic polyps: Secondary | ICD-10-CM | POA: Diagnosis not present

## 2020-10-21 DIAGNOSIS — K5730 Diverticulosis of large intestine without perforation or abscess without bleeding: Secondary | ICD-10-CM

## 2020-10-21 NOTE — Patient Instructions (Addendum)
If you are age 67 or older, your body mass index should be between 23-30. Your Body mass index is 26.09 kg/m. If this is out of the aforementioned range listed, please consider follow up with your Primary Care Provider.  If you are age 34 or younger, your body mass index should be between 19-25. Your Body mass index is 26.09 kg/m. If this is out of the aformentioned range listed, please consider follow up with your Primary Care Provider.   Please return to the clinic in January for a hemorrhoid banding. We do not have the schedule opened at the end of January yet. If you would call back in 1 week at [phone] to schedule  Due to recent changes in healthcare laws, you may see the results of your imaging and laboratory studies on MyChart before your provider has had a chance to review them.  We understand that in some cases there may be results that are confusing or concerning to you. Not all laboratory results come back in the same time frame and the provider may be waiting for multiple results in order to interpret others.  Please give [place] 48 hours in order for your provider to thoroughly review all the results before contacting the office for clarification of your results.   Thank you for choosing me and [facility].  Vito Cirigliano, D.O.

## 2020-10-21 NOTE — Progress Notes (Signed)
P  Chief Complaint:    Medication questions, Diverticular Associated Colitis  GI History: 69 year old female with history of colon polyps, diverticulosis, hemorrhoids.  Longstanding history of intermittent, episodic BRBPR, present for many years.  Tends to be related to stress.  No associate abdominal pain.  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  Started on Apriso.  Family history notable for maternal aunt with CRC > age 62 and son with Crohn's Disease   Endoscopic history: -Multiple colonoscopies in [place] prior to moving to [place].  Started colonoscopies approx age 57, notable for polyps at that time, and has subsequently had frequent surveillance colonoscopy, with a couple of polyps since then.  Also hx of internal hemorrhoids and diverticulosis. -Last colonoscopy in [place] approximate 2016 and notable for tubular adenoma with recommendation repeat in 5 years. -Colonoscopy (08/06/2020, Dr. Bryan Lemma): 2 sessile serrated polyps in transverse and ascending colon, nonbleeding cecal AVM, localized moderate gastritis in sigmoid colon 20-30 cm from anal verge (path: Diverticular Associated Colitis), sigmoid and ascending diverticulosis, grade 2 hemorrhoids.  Normal TI.  Repeat 5 years  HPI:     Patient is a 69 y.o. female presenting to the Gastroenterology Clinic for follow-up.  Colonoscopy completed in 07/2020 and notable for 2 subcentimeter SSP's along with moderate diverticular associated colitis.  Was started on Apriso 1.5 g/daily x6 months.  Serial BMP checks have been normal.  Tolerating medication without issue.  Symptoms are much improved since starting mesalamine.  Decreased stool frequency and stools with more formed. Still occasional bloody stools.   Asked about taking Advil prn for arthritis. Takes infrequently.   Review of systems:     No chest pain, no SOB, no fevers, no urinary sx   Past Medical History:  Diagnosis Date  . Arthritis   .  Diverticulosis   . History of chicken pox   . History of gallstones   . Tubular adenoma 2015   Per Colonoscopy/q57yrs    Patient's surgical history, family medical history, social history, medications and allergies were all reviewed in Epic    Current Outpatient Medications  Medication Sig Dispense Refill  . Ascorbic Acid (VITAMIN C ADULT GUMMIES PO) Take by mouth.    . betamethasone dipropionate 0.05 % cream Apply topically 2 (two) times daily as needed (Rash). (Patient taking differently: Apply topically as needed (Rash). ) 45 g 3  . Calcium Carbonate (CALTRATE 600 PO) Take by mouth.    . fluorouracil (EFUDEX) 5 % cream Apply topically at bedtime. (Patient taking differently: Apply topically as needed. ) 40 g 0  . mesalamine (APRISO) 0.375 g 24 hr capsule Take 1.5 gm daily 120 capsule 5  . Multiple Vitamin (MULTIVITAMIN) tablet Take 2 tablets by mouth daily. Take 2 gummies daily    . ibuprofen (ADVIL) 200 MG tablet Take 200 mg by mouth as needed.  (Patient not taking: Reported on 10/21/2020)     No current facility-administered medications for this visit.    Physical Exam:     BP 130/74   Pulse 80   Ht 5\' 4"  (1.626 m)   Wt 152 lb (68.9 kg)   BMI 26.09 kg/m   GENERAL:  Pleasant female in NAD PSYCH: : Cooperative, normal affect NEURO: Alert and oriented x 3, no focal neurologic deficits   IMPRESSION and PLAN:    1) Segmental Colitis Associated with Diverticulosis (SCAD) -Good clinical response to trial of Apriso -Continue to current dose for plan for 6 months then reassess -BMP checks have  been normal.  Repeat in 6 months  2) Internal hemorrhoids -Suspect continued intermittent BRBPR is from hemorrhoids noted at time of colonoscopy as her SCAD sxs are much improved -Plan for hemorrhoid banding in January.  Discussed hemorrhoid banding technique, pros/cons  3) Indigestion/Dyspepsia -Infrequent postprandial dyspepsia, particular with dietary indiscretions.  Symptoms  respond well to Tums on demand.  No dysphagia or other worrisome features. -Conservative management appropriate  4) Sessile Serrated Polyp -2 subcentimeter SSP's on colonoscopy -Repeat colonoscopy 2026 for ongoing polyp surveillance            [place] ,DO, FACG 10/21/2020, 9:11 AM

## 2020-10-27 ENCOUNTER — Other Ambulatory Visit: Payer: Self-pay

## 2020-10-27 ENCOUNTER — Ambulatory Visit
Admission: RE | Admit: 2020-10-27 | Discharge: 2020-10-27 | Disposition: A | Discharge status: Home / Self Care | Payer: Medicare Other | Source: Ambulatory Visit | Attending: Family Medicine | Admitting: Family Medicine

## 2020-10-27 DIAGNOSIS — Z1231 Encounter for screening mammogram for malignant neoplasm of breast: Secondary | ICD-10-CM | POA: Diagnosis not present

## 2020-10-28 ENCOUNTER — Encounter: Payer: Self-pay | Admitting: Dermatology

## 2020-10-28 ENCOUNTER — Ambulatory Visit: Payer: Medicare Other | Admitting: Dermatology

## 2020-10-28 DIAGNOSIS — Z1283 Encounter for screening for malignant neoplasm of skin: Secondary | ICD-10-CM | POA: Diagnosis not present

## 2020-10-28 DIAGNOSIS — L820 Inflamed seborrheic keratosis: Secondary | ICD-10-CM | POA: Diagnosis not present

## 2020-10-28 DIAGNOSIS — L570 Actinic keratosis: Secondary | ICD-10-CM | POA: Diagnosis not present

## 2020-10-28 DIAGNOSIS — D485 Neoplasm of uncertain behavior of skin: Secondary | ICD-10-CM | POA: Diagnosis not present

## 2020-10-28 NOTE — Telephone Encounter (Signed)
No additional notes needed  

## 2020-10-28 NOTE — Patient Instructions (Signed)

## 2020-11-01 ENCOUNTER — Encounter: Payer: Self-pay | Admitting: Dermatology

## 2020-11-01 NOTE — Progress Notes (Signed)
   Follow-Up Visit   Subjective  Crystal Blair is a 69 y.o. female who presents for the following: Follow-up (upper back skin tag +pick , left jawline also patient did 5 fu on face 2 weeks on and took a break ).  Growth Location: Left upper back Duration:  Quality:  Associated Signs/Symptoms: Modifying Factors:  Severity:  Timing: Context: Also crusts on face  Objective  Well appearing patient in no apparent distress; mood and affect are within normal limits. Objective  Chest - Medial [facility]): Waist up skin examination, no atypical moles, melanoma.  Objective  Neck - Posterior: Pearly 4 mm pink papule: I SK versus BCC.     Objective  Head - Anterior (Face) (5): Pink 2 to 3 mm hornlike crusts   All sun exposed areas plus back examined.   Assessment & Plan    Screening exam for skin cancer Chest - Medial [facility])  Self examined twice annually, dermatologist daily for 2 years.  Neoplasm of uncertain behavior of skin Neck - Posterior  Skin / nail biopsy Type of biopsy: tangential   Informed consent: discussed and consent obtained   Timeout: patient name, date of birth, surgical site, and procedure verified   Procedure prep:  Patient was prepped and draped in usual sterile fashion (Non sterile) Prep type:  Chlorhexidine Anesthesia: the lesion was anesthetized in a standard fashion   Anesthetic:  1% lidocaine w/ epinephrine 1-100,000 local infiltration Instrument used: flexible razor blade   Outcome: patient tolerated procedure well   Post-procedure details: sterile dressing applied and wound care instructions given    Specimen 1 - Surgical pathology Differential Diagnosis: BCC SCC Check Margins: No  After shave biopsy base treated with curettage plus cautery.  0.7 mm  AK (actinic keratosis) (5) Head - Anterior (Face)  Destruction of lesion - Head - Anterior (Face) Complexity: simple   Destruction method: cryotherapy   Informed consent:  discussed and consent obtained   Timeout:  patient name, date of birth, surgical site, and procedure verified Lesion destroyed using liquid nitrogen: Yes   Cryotherapy cycles:  5 Outcome: patient tolerated procedure well with no complications       I, Lavonna Monarch, MD, have reviewed all documentation for this visit.  The documentation on 11/01/20 for the exam, diagnosis, procedures, and orders are all accurate and complete.

## 2020-12-15 ENCOUNTER — Encounter: Payer: Self-pay | Admitting: Family Medicine

## 2020-12-15 DIAGNOSIS — M1712 Unilateral primary osteoarthritis, left knee: Secondary | ICD-10-CM

## 2021-01-26 ENCOUNTER — Encounter: Payer: Self-pay | Admitting: Gastroenterology

## 2021-01-26 ENCOUNTER — Ambulatory Visit: Payer: Medicare Other | Admitting: Gastroenterology

## 2021-01-26 VITALS — BP 130/86 | HR 80 | Ht 64.0 in | Wt 152.4 lb

## 2021-01-26 DIAGNOSIS — K641 Second degree hemorrhoids: Secondary | ICD-10-CM | POA: Diagnosis not present

## 2021-01-26 DIAGNOSIS — K921 Melena: Secondary | ICD-10-CM

## 2021-01-26 DIAGNOSIS — K50119 Crohn's disease of large intestine with unspecified complications: Secondary | ICD-10-CM

## 2021-01-26 MED ORDER — MESALAMINE ER 0.375 G PO CP24: ORAL_CAPSULE | ORAL | 5 refills | Status: DC

## 2021-01-26 NOTE — Patient Instructions (Addendum)
If you are age 68 or older, your body mass index should be between 23-30. Your There is no height or weight on file to calculate BMI. If this is out of the aforementioned range listed, please consider follow up with your Primary Care Provider.  If you are age 72 or younger, your body mass index should be between 19-25. Your There is no height or weight on file to calculate BMI. If this is out of the aformentioned range listed, please consider follow up with your Primary Care Provider.   HEMORRHOID BANDING PROCEDURE    FOLLOW-UP CARE   1. The procedure you have had should have been relatively painless since the banding of the area involved does not have nerve endings and there is no pain sensation.  The rubber band cuts off the blood supply to the hemorrhoid and the band may fall off as soon as 48 hours after the banding (the band may occasionally be seen in the toilet bowl following a bowel movement). You may notice a temporary feeling of fullness in the rectum which should respond adequately to plain Tylenol or Motrin.  2. Following the banding, avoid strenuous exercise that evening and resume full activity the next day.  A sitz bath (soaking in a warm tub) or bidet is soothing, and can be useful for cleansing the area after bowel movements.     3. To avoid constipation, take two tablespoons of natural wheat bran, natural oat bran, flax, Benefiber or any over the counter fiber supplement and increase your water intake to 7-8 glasses daily.    4. Unless you have been prescribed anorectal medication, do not put anything inside your rectum for two weeks: No suppositories, enemas, fingers, etc.  5. Occasionally, you may have more bleeding than usual after the banding procedure.  This is often from the untreated hemorrhoids rather than the treated one.  Don't be concerned if there is a tablespoon or so of blood.  If there is more blood than this, lie flat with your bottom higher than your head and  apply an ice pack to the area. If the bleeding does not stop within a half an hour or if you feel faint, call our office at [phone] or go to the emergency room.  6. Problems are not common; however, if there is a substantial amount of bleeding, severe pain, chills, fever or difficulty passing urine (very rare) or other problems, you should call us at [phone] or report to the nearest emergency room.  7. Do not stay seated continuously for more than 2-3 hours for a day or two after the procedure.  Tighten your buttock muscles 10-15 times every two hours and take 10-15 deep breaths every 1-2 hours.  Do not spend more than a few minutes on the toilet if you cannot empty your bowel; instead re-visit the toilet at a later time.     You are scheduled for your next banding appoinment 02/23/2021 @ 9:40am.  We have sent the following medications to your pharmacy for you to pick up at your convenience: Dorthy Cooler  Due to recent changes in healthcare laws, you may see the results of your imaging and laboratory studies on MyChart before your provider has had a chance to review them.  We understand that in some cases there may be results that are confusing or concerning to you. Not all laboratory results come back in the same time frame and the provider may be waiting for multiple results in  order to interpret others.  Please give [place] 48 hours in order for your provider to thoroughly review all the results before contacting the office for clarification of your results.    Thank you for choosing me and [facility].  Vito Cirigliano, D.O.

## 2021-01-26 NOTE — Progress Notes (Signed)
P  Chief Complaint:    Symptomatic Internal Hemorrhoids; Hemorrhoid Band Ligation; medication management  GI History: 70 year old female with history of colon polyps, diverticulosis, hemorrhoids.   Diagnosed with Segmental Colitis Associated with Diverticulosis (SCAD) on colonoscopy 07/2020. Started on Apriso with good clinical response-decreased stool frequency, improved consistency.  Separately, longstanding history of symptomatic hemorrhoids, described as episodic BRBPR, not completely resolved with adequate treatment of SCAD as above.  Family history notable for maternal aunt with CRC>age 55 and son with Crohn's Disease   Endoscopic history: -Multiple colonoscopies in [place] prior to moving to [place].  Started colonoscopies approx age 78, notable for polypsat that time, and has subsequently had frequent surveillance colonoscopy, with a couple of polyps since then. Also hx of internal hemorrhoids and diverticulosis. -Last colonoscopy in [place] approximate 2016 and notable for tubular adenoma with recommendation repeat in 5 years. -Colonoscopy (08/06/2020, Dr. Bryan Lemma): 2 sessile serrated polyps in transverse and ascending colon, nonbleeding cecal AVM, localized moderate gastritis in sigmoid colon 20-30 cm from anal verge (path: Diverticular Associated Colitis), sigmoid and ascending diverticulosis, grade 2 hemorrhoids.  Normal TI.  Repeat 5 years  HPI:     Patient is a 70 y.o. femalewith a history of symptomatic internal hemorrhoids presenting to [facility] for follow-up and ongoing treatment.  Last seen by me on 10/21/2020.   SCAD currently well-controlled with Apriso, with plan to treat x6 months then reassess.  She would like to discuss this diagnosis and medication today.  Still with intermittent episodes of mucus mixed in with stool, but overall symptoms are much improved since starting Apriso.  Additionally, she continues to have intermittent BRBPR presumably  2/2  symptomatic grade 2 hemorrhoids, which have been largely unresponsive to maximal medical therapy.  She would like to discuss rubber band ligation of these symptomatic hemorrhoids.  No change in medical or surgical history, medications, allergies, social history since last appointment with me.  No new labs or imaging for review since last appointment.   Review of systems:     No chest pain, no SOB, no fevers, no urinary sx   Past Medical History:  Diagnosis Date  . Arthritis   . Diverticulosis   . History of chicken pox   . History of gallstones   . Tubular adenoma 2015   Per Colonoscopy/q70yrs    Patient's surgical history, family medical history, social history, medications and allergies were all reviewed in Epic    Current Outpatient Medications  Medication Sig Dispense Refill  . Ascorbic Acid (VITAMIN C ADULT GUMMIES PO) Take by mouth.    . betamethasone dipropionate 0.05 % cream Apply topically 2 (two) times daily as needed (Rash). (Patient taking differently: Apply topically as needed (Rash). ) 45 g 3  . Calcium Carbonate (CALTRATE 600 PO) Take by mouth.    Marland Kitchen ibuprofen (ADVIL) 200 MG tablet Take 200 mg by mouth as needed.     . mesalamine (APRISO) 0.375 g 24 hr capsule Take 1.5 gm daily 120 capsule 5  . Multiple Vitamin (MULTIVITAMIN) tablet Take 2 tablets by mouth daily. Take 2 gummies daily     No current facility-administered medications for this visit.    Physical Exam:     There were no vitals taken for this visit.  GENERAL:  Pleasant female in NAD PSYCH: : Cooperative, normal affect NEURO: Alert and oriented x 3, no focal neurologic deficits Rectal exam: Sensation intact and preserved anal wink.  Grade 2 hemorrhoids noted in all positions on  anoscopy.  No external anal fissures noted. Normal sphincter tone. No palpable mass. No blood on the exam glove. (Chaperone: Curlene Labrum, CMA).   IMPRESSION and PLAN:    #1.  Symptomatic internal hemorrhoids: PROCEDURE  NOTE: The patient presents with symptomatic grade 2 hemorrhoids, unresponsive to maximal medical therapy, requesting rubber band ligation of symptomatic hemorrhoidal disease.  All risks, benefits and alternative forms of therapy were described and informed consent was obtained.  In the Left Lateral Decubitus position, anoscopic examination revealed grade 2 hemorrhoids in the all position(s).  The anorectum was pre-medicated with RectiCare. The decision was made to band the RA internal hemorrhoid, and the [place] was used to perform band ligation without complication.  Digital anorectal examination was then performed to assure proper positioning of the band, and to adjust the banded tissue as required.  The patient was discharged home without pain or other issues.  Dietary and behavioral recommendations were given and along with follow-up instructions.     The following adjunctive treatments were recommended:  -Resume high-fiber diet with fiber supplement (i.e. Citrucel or Benefiber) with goal for soft stools without straining to have a BM. -Resume adequate fluid intake.  The patient will return in 4 for  follow-up and possible additional banding as required. No complications were encountered and the patient tolerated the procedure well.     #2.  Segmental Colitis Associated with Diverticulosis (SCAD) -Good clinical response to trial of Apriso -Discussed treatment options, and decided to continue Apriso for the time being given good clinical response and plan to reassess after completion of hemorrhoid banding series.  If symptoms completely resolved, will trial off therapy completely. -Otherwise, discussed overall risk/benefit and safety profile of long-term use of 5-ASA therapy.  -BMP checks have been normal.  Repeat in April and if again normal, yearly BMP check reasonable if continued 5-ASA  #3. Sessile Serrated Polyp -2 subcentimeter SSP's on colonoscopy -Repeat colonoscopy  2026 for ongoing polyp surveillance  I spent an additional 20 minutes of nonprocedural time, including independent review of results as outlined above, communicating results with the patient directly, face-to-face time with the patient, coordinating care, ordering studies and medications as appropriate, and documentation.        Lavena Bullion ,DO, FACG 01/26/2021, 8:41 AM

## 2021-02-04 DIAGNOSIS — M1712 Unilateral primary osteoarthritis, left knee: Secondary | ICD-10-CM | POA: Diagnosis not present

## 2021-02-08 DIAGNOSIS — M25662 Stiffness of left knee, not elsewhere classified: Secondary | ICD-10-CM | POA: Diagnosis not present

## 2021-02-08 DIAGNOSIS — M25562 Pain in left knee: Secondary | ICD-10-CM | POA: Diagnosis not present

## 2021-02-08 DIAGNOSIS — M1712 Unilateral primary osteoarthritis, left knee: Secondary | ICD-10-CM | POA: Diagnosis not present

## 2021-02-12 ENCOUNTER — Telehealth: Payer: Self-pay | Admitting: Gastroenterology

## 2021-02-12 NOTE — Telephone Encounter (Signed)
Left detailed message for patient advising that she check with the surgeon to see if they want her to hold the medication or not. Advised that if she has already checked with the surgeon and still has question to please give [place] a call back.

## 2021-02-16 ENCOUNTER — Other Ambulatory Visit: Payer: Self-pay

## 2021-02-17 ENCOUNTER — Encounter: Payer: Self-pay | Admitting: Family Medicine

## 2021-02-17 ENCOUNTER — Ambulatory Visit (INDEPENDENT_AMBULATORY_CARE_PROVIDER_SITE_OTHER): Payer: Medicare Other | Admitting: Family Medicine

## 2021-02-17 VITALS — BP 120/80 | HR 87 | Temp 97.6°F | Ht 64.0 in | Wt 152.6 lb

## 2021-02-17 DIAGNOSIS — E785 Hyperlipidemia, unspecified: Secondary | ICD-10-CM | POA: Diagnosis not present

## 2021-02-17 DIAGNOSIS — Z01818 Encounter for other preprocedural examination: Secondary | ICD-10-CM | POA: Diagnosis not present

## 2021-02-17 DIAGNOSIS — M1712 Unilateral primary osteoarthritis, left knee: Secondary | ICD-10-CM

## 2021-02-17 LAB — LIPID PANEL
Cholesterol: 233 mg/dL — ABNORMAL HIGH (ref 0–200)
HDL: 72.2 mg/dL (ref >39.00)
LDL Cholesterol: 136 mg/dL — ABNORMAL HIGH (ref 0–99)
NonHDL: 160.55
Total CHOL/HDL Ratio: 3
Triglycerides: 123 mg/dL (ref 0.0–149.0)
VLDL: 24.6 mg/dL (ref 0.0–40.0)

## 2021-02-17 LAB — CBC
HCT: 43.8 % (ref 36.0–46.0)
Hemoglobin: 14.9 g/dL (ref 12.0–15.0)
MCHC: 34 g/dL (ref 30.0–36.0)
MCV: 91.8 fl (ref 78.0–100.0)
Platelets: 270 10*3/uL (ref 150.0–400.0)
RBC: 4.77 Mil/uL (ref 3.87–5.11)
RDW: 12.8 % (ref 11.5–15.5)
WBC: 4.9 10*3/uL (ref 4.0–10.5)

## 2021-02-17 LAB — PROTIME-INR
INR: 0.9 ratio (ref 0.8–1.0)
Prothrombin Time: 9.8 s (ref 9.6–13.1)

## 2021-02-17 LAB — COMPREHENSIVE METABOLIC PANEL
ALT: 22 U/L (ref 0–35)
AST: 19 U/L (ref 0–37)
Albumin: 4.7 g/dL (ref 3.5–5.2)
Alkaline Phosphatase: 63 U/L (ref 39–117)
BUN: 11 mg/dL (ref 6–23)
CO2: 31 mEq/L (ref 19–32)
Calcium: 10 mg/dL (ref 8.4–10.5)
Chloride: 102 mEq/L (ref 96–112)
Creatinine, Ser: 0.65 mg/dL (ref 0.40–1.20)
GFR: 91.19 mL/min (ref >60.00)
Glucose, Bld: 84 mg/dL (ref 70–99)
Potassium: 3.9 mEq/L (ref 3.5–5.1)
Sodium: 140 mEq/L (ref 135–145)
Total Bilirubin: 0.8 mg/dL (ref 0.2–1.2)
Total Protein: 7.9 g/dL (ref 6.0–8.3)

## 2021-02-17 LAB — URINALYSIS
Bilirubin Urine: NEGATIVE
Hgb urine dipstick: NEGATIVE
Ketones, ur: NEGATIVE
Leukocytes,Ua: NEGATIVE
Nitrite: NEGATIVE
Specific Gravity, Urine: <=1.005 — AB (ref 1.000–1.030)
Total Protein, Urine: NEGATIVE
Urine Glucose: NEGATIVE
Urobilinogen, UA: 0.2 (ref 0.0–1.0)
pH: 6 (ref 5.0–8.0)

## 2021-02-17 LAB — HEMOGLOBIN A1C: Hgb A1c MFr Bld: 5.5 % (ref 4.6–6.5)

## 2021-02-17 NOTE — Progress Notes (Signed)
Crystal Blair is a 70 y.o. female  Chief Complaint  Patient presents with  . Pre-op Exam    Pt will be having labs performed tomorrow and wanted to know if she was getting labs done here.    JSE:GBTD Crystal Blair is a 70 y.o. female seen today for preoperative evaluation.   Planned Procedure: Lt knee TKA  Surgeon: Dr. Wynelle Link  Date of Surgery: 03/02/21  Recent illness/hospitalization: no  Previous issue with anesthesia: no  Personal/Family history of clotting disorder: no   Past Medical History:  Diagnosis Date  . Arthritis   . Diverticulosis   . History of chicken pox   . History of gallstones   . Tubular adenoma 2015   Per Colonoscopy/q4yrs  . Ulcerative colitis [facility])     Past Surgical History:  Procedure Laterality Date  . BREAST BIOPSY Left 2015  . BREAST EXCISIONAL BIOPSY Left   . CHOLECYSTECTOMY  1991  . COLONOSCOPY     around 2016  . ESOPHAGOGASTRODUODENOSCOPY     around 1993. Dr Roel Cluck.   Marland Kitchen RETINAL TEAR REPAIR CRYOTHERAPY Right 05/18/2020    Social History   Socioeconomic History  . Marital status: Widowed    Spouse name: Not on file  . Number of children: 2  . Years of education: Not on file  . Highest education level: Not on file  Occupational History  . Occupation: works part time  Tobacco Use  . Smoking status: Never Smoker  . Smokeless tobacco: Never Used  Vaping Use  . Vaping Use: Never used  Substance and Sexual Activity  . Alcohol use: Yes    Comment: occasional  . Drug use: Never  . Sexual activity: Yes  Other Topics Concern  . Not on file  Social History Narrative  . Not on file   Social Determinants of Health   Financial Resource Strain: Low Risk   . Difficulty of Paying Living Expenses: Not hard at all  Food Insecurity: No Food Insecurity  . Worried About Charity fundraiser in the Last Year: Never true  . Ran Out of Food in the Last Year: Never true  Transportation Needs: No Transportation Needs  . Lack of  Transportation (Medical): No  . Lack of Transportation (Non-Medical): No  Physical Activity: Inactive  . Days of Exercise per Week: 0 days  . Minutes of Exercise per Session: 0 min  Stress: No Stress Concern Present  . Feeling of Stress : Not at all  Social Connections: Moderately Isolated  . Frequency of Communication with Friends and Family: More than three times a week  . Frequency of Social Gatherings with Friends and Family: Twice a week  . Attends Religious Services: Never  . Active Member of Clubs or Organizations: No  . Attends Archivist Meetings: Never  . Marital Status: Living with partner  Intimate Partner Violence: Not At Risk  . Fear of Current or Ex-Partner: No  . Emotionally Abused: No  . Physically Abused: No  . Sexually Abused: No    Family History  Problem Relation Age of Onset  . Arthritis Mother   . COPD Mother   . Depression Mother   . Alcohol abuse Father   . Early death Father 26  . Hyperlipidemia Father   . Hypertension Father   . Hypertension Sister   . Bladder Cancer Sister   . Crohn's disease Son   . Colon cancer Maternal Aunt   . Esophageal cancer Neg Hx  Immunization History  Administered Date(s) Administered  . Fluad Quad(high Dose 65+) 07/31/2019  . Influenza-Unspecified 10/19/2020  . PFIZER(Purple Top)SARS-COV-2 Vaccination 01/12/2020, 02/05/2020, 09/01/2020  . Pneumococcal Conjugate-13 09/11/2019    Outpatient Encounter Medications as of 02/17/2021  Medication Sig  . Ascorbic Acid (VITAMIN C ADULT GUMMIES PO) Take by mouth.  . Calcium Carbonate (CALTRATE 600 PO) Take by mouth.  . mesalamine (APRISO) 0.375 g 24 hr capsule Take 1.5 gm daily  . Multiple Vitamin (MULTIVITAMIN) tablet Take 2 tablets by mouth daily. Take 2 gummies daily  . ibuprofen (ADVIL) 200 MG tablet Take 200 mg by mouth as needed.  (Patient not taking: Reported on 01/26/2021)   No facility-administered encounter medications on file as of 02/17/2021.      ROS:  Gen: no fever, chills Skin: no rash, itching ENT: no ear pain, ear drainage, nasal congestion, rhinorrhea, sinus pressure, sore throat Eyes: no blurry vision, double vision Resp: no cough, wheeze,SOB CV: no CP, palpitations, LE edema, PND GI: no heartburn, n/v/d/c, abd pain GU: no dysuria, urgency, frequency, hematuria  MSK: Lt knee pain Neuro: no dizziness, headache, weakness   Allergies  Allergen Reactions  . Penicillin G Other (See Comments)    As a kid had a reaction   . Mango Flavor     BP 120/80 (BP Location: Left Arm, Patient Position: Sitting, Cuff Size: Normal)   Pulse 87   Temp 97.6 F (36.4 C) (Temporal)   Ht 5\' 4"  (1.626 m)   Wt 152 lb 9.6 oz (69.2 kg)   SpO2 97%   BMI 26.19 kg/m   Wt Readings from Last 3 Encounters:  02/17/21 152 lb 9.6 oz (69.2 kg)  01/26/21 152 lb 6 oz (69.1 kg)  10/21/20 152 lb (68.9 kg)   Temp Readings from Last 3 Encounters:  02/17/21 97.6 F (36.4 C) (Temporal)  08/06/20 (!) 97.5 F (36.4 C) (Temporal)  06/25/20 (!) 97.3 F (36.3 C)   BP Readings from Last 3 Encounters:  02/17/21 120/80  01/26/21 130/86  10/21/20 130/74   Pulse Readings from Last 3 Encounters:  02/17/21 87  01/26/21 80  10/21/20 80     Physical Exam Constitutional:      General: She is not in acute distress.    Appearance: She is not ill-appearing.  HENT:     Nose: No congestion.     Mouth/Throat:     Mouth: Mucous membranes are moist.     Pharynx: Oropharynx is clear.  Eyes:     Conjunctiva/sclera: Conjunctivae normal.  Cardiovascular:     Rate and Rhythm: Normal rate and regular rhythm.     Pulses: Normal pulses.  Pulmonary:     Effort: Pulmonary effort is normal.     Breath sounds: Normal breath sounds. No wheezing, rhonchi or rales.  Abdominal:     General: Abdomen is flat. Bowel sounds are normal. There is no distension.     Palpations: Abdomen is soft.     Tenderness: There is no abdominal tenderness.  Musculoskeletal:      Right lower leg: No edema.     Left lower leg: No edema.  Lymphadenopathy:     Cervical: No cervical adenopathy.  Neurological:     General: No focal deficit present.     Mental Status: She is alert and oriented to person, place, and time.  Psychiatric:        Mood and Affect: Mood normal.        Behavior: Behavior normal.  Lab Results  Component Value Date   CHOL 215 (H) 09/11/2019   HDL 57.70 09/11/2019   LDLCALC 125 (H) 09/11/2019   TRIG 164.0 (H) 09/11/2019   CHOLHDL 4 09/11/2019     A/P:  1. Preoperative examination 2. Primary osteoarthritis of left knee - pt is acceptable risk for planned procedure - EKG 12-Lead - NSR at 71bmp, no acute changes - Comprehensive metabolic panel - CBC - Urinalysis - Protime-INR - Hemoglobin A1c - pre-op form signed - will fax ekg, form, OV note, labs to ortho  3. Hyperlipidemia, unspecified hyperlipidemia type - Lipid panel

## 2021-02-23 ENCOUNTER — Ambulatory Visit: Payer: Medicare Other | Admitting: Gastroenterology

## 2021-03-02 DIAGNOSIS — G8918 Other acute postprocedural pain: Secondary | ICD-10-CM | POA: Diagnosis not present

## 2021-03-02 DIAGNOSIS — M1712 Unilateral primary osteoarthritis, left knee: Secondary | ICD-10-CM | POA: Diagnosis not present

## 2021-03-02 DIAGNOSIS — Z96652 Presence of left artificial knee joint: Secondary | ICD-10-CM | POA: Diagnosis not present

## 2021-03-02 HISTORY — PX: REPLACEMENT TOTAL KNEE: SUR1224

## 2021-03-09 DIAGNOSIS — M25562 Pain in left knee: Secondary | ICD-10-CM | POA: Diagnosis not present

## 2021-03-12 DIAGNOSIS — M25562 Pain in left knee: Secondary | ICD-10-CM | POA: Diagnosis not present

## 2021-03-15 DIAGNOSIS — M25562 Pain in left knee: Secondary | ICD-10-CM | POA: Diagnosis not present

## 2021-03-17 DIAGNOSIS — M25562 Pain in left knee: Secondary | ICD-10-CM | POA: Diagnosis not present

## 2021-03-19 DIAGNOSIS — M25562 Pain in left knee: Secondary | ICD-10-CM | POA: Diagnosis not present

## 2021-03-22 DIAGNOSIS — M25562 Pain in left knee: Secondary | ICD-10-CM | POA: Diagnosis not present

## 2021-03-24 DIAGNOSIS — M25562 Pain in left knee: Secondary | ICD-10-CM | POA: Diagnosis not present

## 2021-03-30 DIAGNOSIS — M25562 Pain in left knee: Secondary | ICD-10-CM | POA: Diagnosis not present

## 2021-03-31 DIAGNOSIS — Z20822 Contact with and (suspected) exposure to covid-19: Secondary | ICD-10-CM | POA: Diagnosis not present

## 2021-04-07 ENCOUNTER — Ambulatory Visit: Payer: Medicare Other | Attending: Critical Care Medicine

## 2021-04-07 DIAGNOSIS — Z20822 Contact with and (suspected) exposure to covid-19: Secondary | ICD-10-CM

## 2021-04-08 ENCOUNTER — Telehealth (INDEPENDENT_AMBULATORY_CARE_PROVIDER_SITE_OTHER): Payer: Medicare Other | Admitting: Family Medicine

## 2021-04-08 ENCOUNTER — Encounter: Payer: Self-pay | Admitting: Family Medicine

## 2021-04-08 VITALS — Temp 98.0°F | Wt 145.0 lb

## 2021-04-08 DIAGNOSIS — K5190 Ulcerative colitis, unspecified, without complications: Secondary | ICD-10-CM | POA: Insufficient documentation

## 2021-04-08 DIAGNOSIS — U071 COVID-19: Secondary | ICD-10-CM | POA: Diagnosis not present

## 2021-04-08 LAB — SARS-COV-2, NAA 2 DAY TAT

## 2021-04-08 LAB — NOVEL CORONAVIRUS, NAA: SARS-CoV-2, NAA: DETECTED — AB

## 2021-04-08 NOTE — Progress Notes (Signed)
[facility] [address] [place] [address] Dept: [phone] Dept Fax: [phone]  Virtual Video Visit  I connected with Crystal Blair on 04/08/21 at  9:30 AM EDT by a video enabled telemedicine application and verified that I am speaking with the correct person using two identifiers.  Location patient: Home Location provider: Clinic Persons participating in the virtual visit: Patient, Provider  I discussed the limitations of evaluation and management by telemedicine and the availability of in person appointments. The patient expressed understanding and agreed to proceed.  Chief Complaint  Patient presents with  . Acute Visit    C/o having ST, cough, nasal/head cold/cogestion x 1 week.  No OTC meds taken other that Tylenol.  (+) home covid tes yesterday.  Had PCR don 5/11 (-), waiting on PCR results from 04/07/21.    SUBJECTIVE:  HPI: Crystal Blair is a 70 y.o. female who presents with a 1-week history of sore throat, cough, nasal congestion and feelings of a head cold. She denies any fever or dyspnea. Her SO had tested positive for COVID around this time. She performed a home COVID test on 5/11, which was faintly positive. She went to [facility] for a PCR test, which returned negative. She felt like her symptoms were improving over the past week, but then she felt worse yesterday. Her home test on repeat yesterday was positive, She has gone to [facility] for a PCR test which is pending. She notes she is feeling a little better today.  Patient Active Problem List   Diagnosis Date Noted  . Ulcerative colitis ([place]) 04/08/2021  . Well woman exam with routine gynecological exam 09/10/2019  . History of adenomatous polyp of colon 01/31/2019   Past Surgical History:  Procedure Laterality Date  . BREAST BIOPSY Left 2015  . BREAST EXCISIONAL BIOPSY Left   . CHOLECYSTECTOMY  1991  . COLONOSCOPY     around 2016  .  ESOPHAGOGASTRODUODENOSCOPY     around 1993. Dr Roel Cluck.   Marland Kitchen RETINAL TEAR REPAIR CRYOTHERAPY Right 05/18/2020   Family History  Problem Relation Age of Onset  . Arthritis Mother   . COPD Mother   . Depression Mother   . Alcohol abuse Father   . Early death Father 41  . Hyperlipidemia Father   . Hypertension Father   . Hypertension Sister   . Bladder Cancer Sister   . Crohn's disease Son   . Colon cancer Maternal Aunt   . Esophageal cancer Neg Hx    Social History   Tobacco Use  . Smoking status: Never Smoker  . Smokeless tobacco: Never Used  Vaping Use  . Vaping Use: Never used  Substance Use Topics  . Alcohol use: Yes    Comment: occasional  . Drug use: Never    Current Outpatient Medications:  .  mesalamine (APRISO) 0.375 g 24 hr capsule, Take 1.5 gm daily, Disp: 120 capsule, Rfl: 5  Allergies  Allergen Reactions  . Penicillin G Other (See Comments)    As a kid had a reaction   . Mango Flavor    ROS: See pertinent positives and negatives per HPI.  OBSERVATIONS/OBJECTIVE:  VITALS per patient if applicable: Today's Vitals   04/08/21 0937  Temp: 98 F (36.7 C)  TempSrc: Oral  Weight: 145 lb (65.8 kg)   Body mass index is 24.89 kg/m.  GENERAL: Alert and oriented. Appears well and in no acute distress.  HEENT: Atraumatic. Conjunctiva clear. No obvious abnormalities  on inspection of external nose and ears.  NECK: Normal movements of the head and neck.  LUNGS: On inspection, no signs of respiratory distress. Breathing rate appears normal. No obvious gross SOB, gasping or wheezing, and no conversational dyspnea. No coughing.  CV: No obvious cyanosis.  MS: Moves all visible extremities without noticeable abnormality.  PSYCH/NEURO: Pleasant and cooperative. No obvious depression or anxiety. Speech and thought processing grossly intact.  ASSESSMENT AND PLAN:  1. COVID-19 Crystal Blair Likely does have COVID based on her description, and possibly had this  7 days ago. She is low risk for complications of COVID. In light of her likely being out of the 5-day window for treatment with antivirals, I do not recommend she start these at this point.  Reviewed home care instructions for COVID. Advised self-isolation at home for at least 5 days. After 5 days, if improved and fever resolved, can be in public, but should wear a mask around others for an additional 5 days. If symptoms, esp, dyspnea develops/worsens, recommend in-person evaluation at either an urgent care, emergency room.   I discussed the assessment and treatment plan with the patient. The patient was provided an opportunity to ask questions and all were answered. The patient agreed with the plan and demonstrated an understanding of the instructions.   The patient was advised to call back or seek an in-person evaluation if the symptoms worsen or if the condition fails to improve as anticipated.   Haydee Salter, MD

## 2021-04-13 DIAGNOSIS — M25562 Pain in left knee: Secondary | ICD-10-CM | POA: Diagnosis not present

## 2021-04-14 DIAGNOSIS — Z471 Aftercare following joint replacement surgery: Secondary | ICD-10-CM | POA: Diagnosis not present

## 2021-04-14 DIAGNOSIS — Z96652 Presence of left artificial knee joint: Secondary | ICD-10-CM | POA: Diagnosis not present

## 2021-04-22 DIAGNOSIS — M25562 Pain in left knee: Secondary | ICD-10-CM | POA: Diagnosis not present

## 2021-04-29 DIAGNOSIS — M25562 Pain in left knee: Secondary | ICD-10-CM | POA: Diagnosis not present

## 2021-05-05 DIAGNOSIS — M25562 Pain in left knee: Secondary | ICD-10-CM | POA: Diagnosis not present

## 2021-05-10 DIAGNOSIS — M25562 Pain in left knee: Secondary | ICD-10-CM | POA: Diagnosis not present

## 2021-05-17 DIAGNOSIS — M25562 Pain in left knee: Secondary | ICD-10-CM | POA: Diagnosis not present

## 2021-07-01 ENCOUNTER — Ambulatory Visit: Payer: Medicare Other

## 2021-07-06 ENCOUNTER — Ambulatory Visit: Payer: Medicare Other

## 2021-07-06 ENCOUNTER — Ambulatory Visit (INDEPENDENT_AMBULATORY_CARE_PROVIDER_SITE_OTHER): Payer: Medicare Other | Admitting: *Deleted

## 2021-07-06 DIAGNOSIS — Z0000 Encounter for general adult medical examination without abnormal findings: Secondary | ICD-10-CM

## 2021-07-06 DIAGNOSIS — Z780 Asymptomatic menopausal state: Secondary | ICD-10-CM | POA: Diagnosis not present

## 2021-07-06 NOTE — Patient Instructions (Signed)
[name] , Thank you for taking time to come for your Medicare Wellness Visit. I appreciate your ongoing commitment to your health goals. Please review the following plan we discussed and let me know if I can assist you in the future.   Screening recommendations/referrals: Colonoscopy: up to date Mammogram: up to date Bone Density: up to date Recommended yearly ophthalmology/optometry visit for glaucoma screening and checkup Recommended yearly dental visit for hygiene and checkup  Vaccinations: Influenza vaccine: up to date Pneumococcal vaccine: Education provided Tdap vaccine: Education provided Shingles vaccine: Education provided    Advanced directives: copy requested  Conditions/risks identified:  2  Next appointment: 08-10-2021 @ 8:00 am Dr. Lorayne Marek   Preventive Care 68 Years and Older, Female Preventive care refers to lifestyle choices and visits with your health care provider that can promote health and wellness. What does preventive care include? A yearly physical exam. This is also called an annual well check. Dental exams once or twice a year. Routine eye exams. Ask your health care provider how often you should have your eyes checked. Personal lifestyle choices, including: Daily care of your teeth and gums. Regular physical activity. Eating a healthy diet. Avoiding tobacco and drug use. Limiting alcohol use. Practicing safe sex. Taking low-dose aspirin every day. Taking vitamin and mineral supplements as recommended by your health care provider. What happens during an annual well check? The services and screenings done by your health care provider during your annual well check will depend on your age, overall health, lifestyle risk factors, and family history of disease. Counseling  Your health care provider may ask you questions about your: Alcohol use. Tobacco use. Drug use. Emotional well-being. Home and relationship well-being. Sexual activity. Eating  habits. History of falls. Memory and ability to understand (cognition). Work and work Statistician. Reproductive health. Screening  You may have the following tests or measurements: Height, weight, and BMI. Blood pressure. Lipid and cholesterol levels. These may be checked every 5 years, or more frequently if you are over 19 years old. Skin check. Lung cancer screening. You may have this screening every year starting at age 27 if you have a 30-pack-year history of smoking and currently smoke or have quit within the past 15 years. Fecal occult blood test (FOBT) of the stool. You may have this test every year starting at age 10. Flexible sigmoidoscopy or colonoscopy. You may have a sigmoidoscopy every 5 years or a colonoscopy every 10 years starting at age 28. Hepatitis C blood test. Hepatitis B blood test. Sexually transmitted disease (STD) testing. Diabetes screening. This is done by checking your blood sugar (glucose) after you have not eaten for a while (fasting). You may have this done every 1-3 years. Bone density scan. This is done to screen for osteoporosis. You may have this done starting at age 36. Mammogram. This may be done every 1-2 years. Talk to your health care provider about how often you should have regular mammograms. Talk with your health care provider about your test results, treatment options, and if necessary, the need for more tests. Vaccines  Your health care provider may recommend certain vaccines, such as: Influenza vaccine. This is recommended every year. Tetanus, diphtheria, and acellular pertussis (Tdap, Td) vaccine. You may need a Td booster every 10 years. Zoster vaccine. You may need this after age 19. Pneumococcal 13-valent conjugate (PCV13) vaccine. One dose is recommended after age 58. Pneumococcal polysaccharide (PPSV23) vaccine. One dose is recommended after age 55. Talk to your health care  provider about which screenings and vaccines you need and how  often you need them. This information is not intended to replace advice given to you by your health care provider. Make sure you discuss any questions you have with your health care provider. Document Released: 12/04/2015 Document Revised: 07/27/2016 Document Reviewed: 09/08/2015 Elsevier Interactive Patient Education  2017 [employer] Prevention in the Home Falls can cause injuries. They can happen to people of all ages. There are many things you can do to make your home safe and to help prevent falls. What can I do on the outside of my home? Regularly fix the edges of walkways and driveways and fix any cracks. Remove anything that might make you trip as you walk through a door, such as a raised step or threshold. Trim any bushes or trees on the path to your home. Use bright outdoor lighting. Clear any walking paths of anything that might make someone trip, such as rocks or tools. Regularly check to see if handrails are loose or broken. Make sure that both sides of any steps have handrails. Any raised decks and porches should have guardrails on the edges. Have any leaves, snow, or ice cleared regularly. Use sand or salt on walking paths during winter. Clean up any spills in your garage right away. This includes oil or grease spills. What can I do in the bathroom? Use night lights. Install grab bars by the toilet and in the tub and shower. Do not use towel bars as grab bars. Use non-skid mats or decals in the tub or shower. If you need to sit down in the shower, use a plastic, non-slip stool. Keep the floor dry. Clean up any water that spills on the floor as soon as it happens. Remove soap buildup in the tub or shower regularly. Attach bath mats securely with double-sided non-slip rug tape. Do not have throw rugs and other things on the floor that can make you trip. What can I do in the bedroom? Use night lights. Make sure that you have a light by your bed that is easy to  reach. Do not use any sheets or blankets that are too big for your bed. They should not hang down onto the floor. Have a firm chair that has side arms. You can use this for support while you get dressed. Do not have throw rugs and other things on the floor that can make you trip. What can I do in the kitchen? Clean up any spills right away. Avoid walking on wet floors. Keep items that you use a lot in easy-to-reach places. If you need to reach something above you, use a strong step stool that has a grab bar. Keep electrical cords out of the way. Do not use floor polish or wax that makes floors slippery. If you must use wax, use non-skid floor wax. Do not have throw rugs and other things on the floor that can make you trip. What can I do with my stairs? Do not leave any items on the stairs. Make sure that there are handrails on both sides of the stairs and use them. Fix handrails that are broken or loose. Make sure that handrails are as long as the stairways. Check any carpeting to make sure that it is firmly attached to the stairs. Fix any carpet that is loose or worn. Avoid having throw rugs at the top or bottom of the stairs. If you do have throw rugs, attach them to the  floor with carpet tape. Make sure that you have a light switch at the top of the stairs and the bottom of the stairs. If you do not have them, ask someone to add them for you. What else can I do to help prevent falls? Wear shoes that: Do not have high heels. Have rubber bottoms. Are comfortable and fit you well. Are closed at the toe. Do not wear sandals. If you use a stepladder: Make sure that it is fully opened. Do not climb a closed stepladder. Make sure that both sides of the stepladder are locked into place. Ask someone to hold it for you, if possible. Clearly mark and make sure that you can see: Any grab bars or handrails. First and last steps. Where the edge of each step is. Use tools that help you move  around (mobility aids) if they are needed. These include: Canes. Walkers. Scooters. Crutches. Turn on the lights when you go into a dark area. Replace any light bulbs as soon as they burn out. Set up your furniture so you have a clear path. Avoid moving your furniture around. If any of your floors are uneven, fix them. If there are any pets around you, be aware of where they are. Review your medicines with your doctor. Some medicines can make you feel dizzy. This can increase your chance of falling. Ask your doctor what other things that you can do to help prevent falls. This information is not intended to replace advice given to you by your health care provider. Make sure you discuss any questions you have with your health care provider. Document Released: 09/03/2009 Document Revised: 04/14/2016 Document Reviewed: 12/12/2014 Elsevier Interactive Patient Education  2017 [employer].

## 2021-07-06 NOTE — Progress Notes (Signed)
Subjective:   Crystal Blair is a 70 y.o. female who presents for Medicare Annual (Subsequent) preventive examination.  I connected with  Crystal Blair on 07/06/21 by a  telephone enabled telemedicine application and verified that I am speaking with the correct person using two identifiers.   I discussed the limitations of evaluation and management by telemedicine. The patient expressed understanding and agreed to proceed.   Review of Systems    NA Cardiac Risk Factors include: advanced age (>7mn, >>75women)     Objective:    Today's Vitals   There is no height or weight on file to calculate BMI.  Advanced Directives 06/25/2020  Does Patient Have a Medical Advance Directive? No  Would patient like information on creating a medical advance directive? Yes (MAU/Ambulatory/Procedural Areas - Information given)    Current Medications (verified) Outpatient Encounter Medications as of 07/06/2021  Medication Sig   mesalamine (APRISO) 0.375 g 24 hr capsule Take 1.5 gm daily   No facility-administered encounter medications on file as of 07/06/2021.    Allergies (verified) Penicillin g and Mango flavor   History: Past Medical History:  Diagnosis Date   Arthritis    Diverticulosis    History of chicken pox    History of gallstones    Tubular adenoma 2015   Per Colonoscopy/q516yr  Ulcerative colitis ([place]   Past Surgical History:  Procedure Laterality Date   BREAST BIOPSY Left 2015   BREAST EXCISIONAL BIOPSY Left    CHOLECYSTECTOMY  1991   COLONOSCOPY     around 2016   ESOPHAGOGASTRODUODENOSCOPY     around 1993. Dr MaRoel Cluck   RETINAL TEAR REPAIR CRYOTHERAPY Right 05/18/2020   Family History  Problem Relation Age of Onset   Arthritis Mother    COPD Mother    Depression Mother    Alcohol abuse Father    Early death Father [id number] Hyperlipidemia Father    Hypertension Father    Hypertension Sister    Bladder Cancer Sister    Crohn's disease Son    Colon  cancer Maternal Aunt    Esophageal cancer Neg Hx    Social History   Socioeconomic History   Marital status: Widowed    Spouse name: Not on file   Number of children: 2   Years of education: Not on file   Highest education level: Not on file  Occupational History   Occupation: works part time  Tobacco Use   Smoking status: Never   Smokeless tobacco: Never  Vaping Use   Vaping Use: Never used  Substance and Sexual Activity   Alcohol use: Yes    Comment: occasional   Drug use: Never   Sexual activity: Yes  Other Topics Concern   Not on file  Social History Narrative   Not on file   Social Determinants of Health   Financial Resource Strain: Low Risk    Difficulty of Paying Living Expenses: Not hard at all  Food Insecurity: No Food Insecurity   Worried About RuCharity fundraisern the Last Year: Never true   [place] the Last Year: Never true  Transportation Needs: No Transportation Needs   Lack of Transportation (Medical): No   Lack of Transportation (Non-Medical): No  Physical Activity: Insufficiently Active   Days of Exercise per Week: 3 days   Minutes of Exercise per Session: 30 min  Stress: No Stress Concern Present   Feeling of Stress : Not  at all  Social Connections: Moderately Isolated   Frequency of Communication with Friends and Family: More than three times a week   Frequency of Social Gatherings with Friends and Family: More than three times a week   Attends Religious Services: Never   Marine scientist or Organizations: No   Attends Music therapist: Never   Marital Status: Living with partner    Tobacco Counseling Counseling given: Not Answered   Clinical Intake:  Pre-visit preparation completed: Yes  Pain : No/denies pain     Nutritional Risks: None Diabetes: No  How often do you need to have someone help you when you read instructions, pamphlets, or other written materials from your doctor or pharmacy?: 1 -  Never  Diabetic?no  Interpreter Needed?: No  Information entered by :: Leroy Kennedy LPN   Activities of Daily Living In your present state of health, do you have any difficulty performing the following activities: 07/06/2021  Hearing? N  Vision? N  Difficulty concentrating or making decisions? N  Walking or climbing stairs? N  Dressing or bathing? N  Doing errands, shopping? N  Preparing Food and eating ? N  Using the Toilet? N  In the past six months, have you accidently leaked urine? N  Do you have problems with loss of bowel control? N  Managing your Medications? N  Managing your Finances? N  Housekeeping or managing your Housekeeping? N  Some recent data might be hidden    Patient Care Team: Ronnald Nian, DO as PCP - General (Family Medicine) Lavonna Monarch, MD as Consulting Physician (Dermatology)  Indicate any recent Medical Services you may have received from other than [facility] providers in the past year (date may be approximate).     Assessment:   This is a routine wellness examination for [place].  Hearing/Vision screen Hearing Screening - Comments:: No trouble hearing Vision Screening - Comments:: Up to date [place] issues and exercise activities discussed: Current Exercise Habits: Home exercise routine, Type of exercise: walking, Time (Minutes): 30, Frequency (Times/Week): 4, Weekly Exercise (Minutes/Week): 120, Intensity: Moderate   Goals Addressed             This Visit's Progress    Patient Stated   On track    Maintain current health     Patient Stated       Continue current lifestyle       Depression Screen PHQ 2/9 Scores 07/06/2021 06/25/2020 09/11/2019 01/31/2019  PHQ - 2 Score 0 0 0 1  PHQ- 9 Score - - 0 3    Fall Risk Fall Risk  07/06/2021 06/25/2020 01/31/2019  Falls in the past year? 0 1 0  Number falls in past yr: 0 0 -  Injury with Fall? 0 1 -  Risk for fall due to : - History of fall(s) -  Follow up Falls  evaluation completed;Falls prevention discussed Falls prevention discussed Falls evaluation completed    FALL RISK PREVENTION PERTAINING TO THE HOME:  Any stairs in or around the home? No  If so, are there any without handrails? No  Home free of loose throw rugs in walkways, pet beds, electrical cords, etc? Yes  Adequate lighting in your home to reduce risk of falls? Yes   ASSISTIVE DEVICES UTILIZED TO PREVENT FALLS:  Life alert? No  Use of a cane, walker or w/c? No  Grab bars in the bathroom? Yes  Shower chair or bench in shower? No  Elevated  toilet seat or a handicapped toilet? No   TIMED UP AND GO:  Was the test performed? No .   Cognitive Function:  Normal cognitive status assessed by direct observation by this Nurse Health Advisor. No abnormalities found.          Immunizations Immunization History  Administered Date(s) Administered   Fluad Quad(high Dose 65+) 07/31/2019   Influenza-Unspecified 10/19/2020   PFIZER(Purple Top)SARS-COV-2 Vaccination 01/12/2020, 02/05/2020, 09/01/2020   Pneumococcal Conjugate-13 09/11/2019    TDAP status: Due, Education has been provided regarding the importance of this vaccine. Advised may receive this vaccine at local pharmacy or Health Dept. Aware to provide a copy of the vaccination record if obtained from local pharmacy or Health Dept. Verbalized acceptance and understanding.  Flu Vaccine status: Up to date  Pneumococcal vaccine status: Due, Education has been provided regarding the importance of this vaccine. Advised may receive this vaccine at local pharmacy or Health Dept. Aware to provide a copy of the vaccination record if obtained from local pharmacy or Health Dept. Verbalized acceptance and understanding.  Covid-19 vaccine status: Information provided on how to obtain vaccines.   Qualifies for Shingles Vaccine? Yes   Zostavax completed No   Shingrix Completed?: No.    Education has been provided regarding the importance  of this vaccine. Patient has been advised to call insurance company to determine out of pocket expense if they have not yet received this vaccine. Advised may also receive vaccine at local pharmacy or Health Dept. Verbalized acceptance and understanding.  Screening Tests Health Maintenance  Topic Date Due   TETANUS/TDAP  Never done   Zoster Vaccines- Shingrix (1 of 2) Never done   PNA vac Low Risk Adult (2 of 2 - PPSV23) 09/10/2020   COVID-19 Vaccine (4 - Booster for Pfizer series) 12/02/2020   INFLUENZA VACCINE  06/21/2021   MAMMOGRAM  10/27/2022   COLONOSCOPY (Pts 45-31yr Insurance coverage will need to be confirmed)  08/06/2030   DEXA SCAN  Completed   Hepatitis C Screening  Completed   HPV VACCINES  Aged Out    Health Maintenance  Health Maintenance Due  Topic Date Due   TETANUS/TDAP  Never done   Zoster Vaccines- Shingrix (1 of 2) Never done   PNA vac Low Risk Adult (2 of 2 - PPSV23) 09/10/2020   COVID-19 Vaccine (4 - Booster for Pfizer series) 12/02/2020   INFLUENZA VACCINE  06/21/2021    Colorectal cancer screening: Type of screening: Colonoscopy. Completed 2021. Repeat every 5 years  Mammogram status: Completed  . Repeat every year  Bone Density status: Ordered  . Pt provided with contact info and advised to call to schedule appt.  Lung Cancer Screening: (Low Dose CT Chest recommended if Age 68-80years, 30 pack-year currently smoking OR have quit w/in 15years.) does not qualify.   Lung Cancer Screening Referral:   Additional Screening:  Hepatitis C Screening: does not qualify; Completed 2020  Vision Screening: Recommended annual ophthalmology exams for early detection of glaucoma and other disorders of the eye. Is the patient up to date with their annual eye exam?  Yes  Who is the provider or what is the name of the office in which the patient attends annual eye exams? [facility] pt is not established with a provider, would they like to be referred to a  provider to establish care? No .   Dental Screening: Recommended annual dental exams for proper oral hygiene  Community Resource Referral / Chronic Care Management: CRR  required this visit?  No   CCM required this visit?  No      Plan:     I have personally reviewed and noted the following in the patient's chart:   Medical and social history Use of alcohol, tobacco or illicit drugs  Current medications and supplements including opioid prescriptions.  Functional ability and status Nutritional status Physical activity Advanced directives List of other physicians Hospitalizations, surgeries, and ER visits in previous 12 months Vitals Screenings to include cognitive, depression, and falls Referrals and appointments  In addition, I have reviewed and discussed with patient certain preventive protocols, quality metrics, and best practice recommendations. A written personalized care plan for preventive services as well as general preventive health recommendations were provided to patient.     Leroy Kennedy, LPN   [phone]   Nurse Notes: NA

## 2021-07-09 DIAGNOSIS — K50119 Crohn's disease of large intestine with unspecified complications: Secondary | ICD-10-CM

## 2021-07-09 NOTE — Telephone Encounter (Signed)
Yes, right on schedule.  Plan for repeat BMP now.  If that one is normal, yearly BMP check would be reasonable.  Thank you.

## 2021-07-13 ENCOUNTER — Other Ambulatory Visit: Payer: Medicare Other

## 2021-07-13 DIAGNOSIS — K50119 Crohn's disease of large intestine with unspecified complications: Secondary | ICD-10-CM

## 2021-07-13 LAB — BASIC METABOLIC PANEL
BUN: 12 mg/dL (ref 6–23)
CO2: 28 mEq/L (ref 19–32)
Calcium: 10.1 mg/dL (ref 8.4–10.5)
Chloride: 102 mEq/L (ref 96–112)
Creatinine, Ser: 0.66 mg/dL (ref 0.40–1.20)
GFR: 90.6 mL/min (ref >60.00)
Glucose, Bld: 85 mg/dL (ref 70–99)
Potassium: 4.5 mEq/L (ref 3.5–5.1)
Sodium: 138 mEq/L (ref 135–145)

## 2021-07-15 ENCOUNTER — Other Ambulatory Visit: Payer: Self-pay

## 2021-07-16 ENCOUNTER — Encounter: Payer: Self-pay | Admitting: Nurse Practitioner

## 2021-07-16 ENCOUNTER — Ambulatory Visit (INDEPENDENT_AMBULATORY_CARE_PROVIDER_SITE_OTHER): Payer: Medicare Other | Admitting: Nurse Practitioner

## 2021-07-16 VITALS — BP 120/70 | HR 79 | Temp 97.0°F | Ht 64.0 in | Wt 143.0 lb

## 2021-07-16 DIAGNOSIS — Z0000 Encounter for general adult medical examination without abnormal findings: Secondary | ICD-10-CM | POA: Diagnosis not present

## 2021-07-16 NOTE — Patient Instructions (Addendum)
Bring copy of Advanced directive. Schedule appt for mammogram. Return to influenza and pneumonia vaccine in September. Get shingrix and TDAP vaccine from retail pharmacy. Look up easy posture stretch to help improve your posture: www.braceability.com  Back Exercises The following exercises strengthen the muscles that help to support the trunk and back. They also help to keep the lower back flexible. Doing these exercises can help to prevent back pain or lessen existing pain. If you have back pain or discomfort, try doing these exercises 2-3 times each day or as told by your health care provider. As your pain improves, do them once each day, but increase the number of times that you repeat the steps for each exercise (do more repetitions). To prevent the recurrence of back pain, continue to do these exercises once each day or as told by your health care provider. Do exercises exactly as told by your health care provider and adjust them as directed. It is normal to feel mild stretching, pulling, tightness, or discomfort as you do these exercises, but you should stop right away if youfeel sudden pain or your pain gets worse. Exercises Single knee to chest Repeat these steps 3-5 times for each leg: Lie on your back on a firm bed or the floor with your legs extended. Bring one knee to your chest. Your other leg should stay extended and in contact with the floor. Hold your knee in place by grabbing your knee or thigh with both hands and hold. Pull on your knee until you feel a gentle stretch in your lower back or buttocks. Hold the stretch for 10-30 seconds. Slowly release and straighten your leg. Pelvic tilt Repeat these steps 5-10 times: Lie on your back on a firm bed or the floor with your legs extended. Bend your knees so they are pointing toward the ceiling and your feet are flat on the floor. Tighten your lower abdominal muscles to press your lower back against the floor. This motion will  tilt your pelvis so your tailbone points up toward the ceiling instead of pointing to your feet or the floor. With gentle tension and even breathing, hold this position for 5-10 seconds. Cat-cow Repeat these steps until your lower back becomes more flexible: Get into a hands-and-knees position on a firm surface. Keep your hands under your shoulders, and keep your knees under your hips. You may place padding under your knees for comfort. Let your head hang down toward your chest. Contract your abdominal muscles and point your tailbone toward the floor so your lower back becomes rounded like the back of a cat. Hold this position for 5 seconds. Slowly lift your head, let your abdominal muscles relax and point your tailbone up toward the ceiling so your back forms a sagging arch like the back of a cow. Hold this position for 5 seconds.  Press-ups Repeat these steps 5-10 times: Lie on your abdomen (face-down) on the floor. Place your palms near your head, about shoulder-width apart. Keeping your back as relaxed as possible and keeping your hips on the floor, slowly straighten your arms to raise the top half of your body and lift your shoulders. Do not use your back muscles to raise your upper torso. You may adjust the placement of your hands to make yourself more comfortable. Hold this position for 5 seconds while you keep your back relaxed. Slowly return to lying flat on the floor.  Bridges Repeat these steps 10 times: Lie on your back on a firm surface.  Bend your knees so they are pointing toward the ceiling and your feet are flat on the floor. Your arms should be flat at your sides, next to your body. Tighten your buttocks muscles and lift your buttocks off the floor until your waist is at almost the same height as your knees. You should feel the muscles working in your buttocks and the back of your thighs. If you do not feel these muscles, slide your feet 1-2 inches farther away from your  buttocks. Hold this position for 3-5 seconds. Slowly lower your hips to the starting position, and allow your buttocks muscles to relax completely. If this exercise is too easy, try doing it with your arms crossed over yourchest. Abdominal crunches Repeat these steps 5-10 times: Lie on your back on a firm bed or the floor with your legs extended. Bend your knees so they are pointing toward the ceiling and your feet are flat on the floor. Cross your arms over your chest. Tip your chin slightly toward your chest without bending your neck. Tighten your abdominal muscles and slowly raise your trunk (torso) high enough to lift your shoulder blades a tiny bit off the floor. Avoid raising your torso higher than that because it can put too much stress on your low back and does not help to strengthen your abdominal muscles. Slowly return to your starting position. Back lifts Repeat these steps 5-10 times: Lie on your abdomen (face-down) with your arms at your sides, and rest your forehead on the floor. Tighten the muscles in your legs and your buttocks. Slowly lift your chest off the floor while you keep your hips pressed to the floor. Keep the back of your head in line with the curve in your back. Your eyes should be looking at the floor. Hold this position for 3-5 seconds. Slowly return to your starting position. Contact a health care provider if: Your back pain or discomfort gets much worse when you do an exercise. Your worsening back pain or discomfort does not lessen within 2 hours after you exercise. If you have any of these problems, stop doing these exercises right away. Do not do them again unless your health care provider says that you can. Get help right away if: You develop sudden, severe back pain. If this happens, stop doing the exercises right away. Do not do them again unless your health care provider says that you can. This information is not intended to replace advice given to you  by your health care provider. Make sure you discuss any questions you have with your healthcare provider. Document Revised: 03/14/2019 Document Reviewed: 08/09/2018 Elsevier Patient Education  [employer].

## 2021-07-16 NOTE — Progress Notes (Signed)
Subjective:    Patient ID: Crystal Blair, female    DOB: 13-Sep-1951, 70 y.o.   MRN: [id number]  Patient presents today for CPE   HPI Mammogram:will schedule Colonoscopy:up to date Vision:will schedule, hx of retinal tear 2021 Dental:up to date Diet:regular. Exercise: plans to start walking Weight:  Wt Readings from Last 3 Encounters:  07/16/21 143 lb (64.9 kg)  04/08/21 145 lb (65.8 kg)  02/17/21 152 lb 9.6 oz (69.2 kg)    Depression/Suicide: Depression screen [facility] 2/9 07/16/2021 07/06/2021 06/25/2020 09/11/2019 01/31/2019  Decreased Interest 0 0 0 0 0  Down, Depressed, Hopeless 0 0 0 0 1  PHQ - 2 Score 0 0 0 0 1  Altered sleeping 0 - - 0 1  Tired, decreased energy 1 - - 0 1  Change in appetite 0 - - 0 0  Feeling bad or failure about yourself  0 - - 0 0  Trouble concentrating 0 - - 0 0  Moving slowly or fidgety/restless 0 - - 0 0  Suicidal thoughts 0 - - 0 0  PHQ-9 Score 1 - - 0 3  Difficult doing work/chores Not difficult at all - - Not difficult at all Somewhat difficult   Immunizations: (TDAP, Hep C screen, Pneumovax, Influenza, zoster)  Health Maintenance  Topic Date Due   Tetanus Vaccine  Never done   Zoster (Shingles) Vaccine (1 of 2) Never done   Pneumonia vaccines (2 of 2 - PPSV23) 09/10/2020   COVID-19 Vaccine (4 - Booster for Pfizer series) 12/02/2020   Flu Shot  06/21/2021   Mammogram  10/27/2022   Colon Cancer Screening  08/06/2025   DEXA scan (bone density measurement)  Completed   Hepatitis C Screening: USPSTF Recommendation to screen - Ages 80-79 yo.  Completed   HPV Vaccine  Aged Out   Fall Risk: Fall Risk  07/06/2021 06/25/2020 01/31/2019  Falls in the past year? 0 1 0  Number falls in past yr: 0 0 -  Injury with Fall? 0 1 -  Risk for fall due to : - History of fall(s) -  Follow up Falls evaluation completed;Falls prevention discussed Falls prevention discussed Falls evaluation completed   Advanced Directive: Advanced Directives 07/06/2021  Does  Patient Have a Medical Advance Directive? Yes  Type of Advance Directive [place] in Chart? No - copy requested  Would patient like information on creating a medical advance directive? -    Medications and allergies reviewed with patient and updated if appropriate.  Patient Active Problem List   Diagnosis Date Noted   Ulcerative colitis ([place]) 04/08/2021   Well woman exam without gynecological exam 09/10/2019   History of adenomatous polyp of colon 01/31/2019   Current Outpatient Medications on File Prior to Visit  Medication Sig Dispense Refill   mesalamine (APRISO) 0.375 g 24 hr capsule Take 1.5 gm daily 120 capsule 5   No current facility-administered medications on file prior to visit.    Past Medical History:  Diagnosis Date   Arthritis    Diverticulosis    History of chicken pox    History of gallstones    Tubular adenoma 2015   Per Colonoscopy/q53yr   Ulcerative colitis ([facility]     Past Surgical History:  Procedure Laterality Date   BREAST BIOPSY Left 2015   BREAST EXCISIONAL BIOPSY Left    CHOLECYSTECTOMY  1991   COLONOSCOPY     around 2016   ESOPHAGOGASTRODUODENOSCOPY  around 1993. Dr Roel Cluck.    RETINAL TEAR REPAIR CRYOTHERAPY Right 05/18/2020    Social History   Socioeconomic History   Marital status: Widowed    Spouse name: Not on file   Number of children: 2   Years of education: Not on file   Highest education level: Not on file  Occupational History   Occupation: works part time  Tobacco Use   Smoking status: Never   Smokeless tobacco: Never  Vaping Use   Vaping Use: Never used  Substance and Sexual Activity   Alcohol use: Yes    Comment: occasional   Drug use: Never   Sexual activity: Yes  Other Topics Concern   Not on file  Social History Narrative   Not on file   Social Determinants of Health   Financial Resource Strain: Low Risk    Difficulty of Paying Living Expenses:  Not hard at all  Food Insecurity: No Food Insecurity   Worried About Charity fundraiser in the Last Year: Never true   [place] in the Last Year: Never true  Transportation Needs: No Transportation Needs   Lack of Transportation (Medical): No   Lack of Transportation (Non-Medical): No  Physical Activity: Insufficiently Active   Days of Exercise per Week: 3 days   Minutes of Exercise per Session: 30 min  Stress: No Stress Concern Present   Feeling of Stress : Not at all  Social Connections: Moderately Isolated   Frequency of Communication with Friends and Family: More than three times a week   Frequency of Social Gatherings with Friends and Family: More than three times a week   Attends Religious Services: Never   Marine scientist or Organizations: No   Attends Music therapist: Never   Marital Status: Living with partner    Family History  Problem Relation Age of Onset   Arthritis Mother    COPD Mother    Depression Mother    Alcohol abuse Father    Early death Father 47   Hyperlipidemia Father    Hypertension Father    Hypertension Sister    Bladder Cancer Sister    Crohn's disease Son    Colon cancer Maternal Aunt    Esophageal cancer Neg Hx         Review of Systems  Constitutional:  Negative for fever, malaise/fatigue and weight loss.  HENT:  Negative for congestion and sore throat.   Eyes:        Negative for visual changes  Respiratory:  Negative for cough and shortness of breath.   Cardiovascular:  Negative for chest pain, palpitations and leg swelling.  Gastrointestinal:  Negative for blood in stool, constipation, diarrhea and heartburn.  Genitourinary:  Negative for dysuria, frequency and urgency.  Musculoskeletal:  Negative for falls, joint pain and myalgias.  Skin:  Negative for rash.  Neurological:  Negative for dizziness, sensory change and headaches.  Endo/Heme/Allergies:  Does not bruise/bleed easily.   Psychiatric/Behavioral:  Negative for depression, substance abuse and suicidal ideas. The patient is not nervous/anxious.    Objective:   Vitals:   07/16/21 1014  BP: 120/70  Pulse: 79  Temp: (!) 97 F (36.1 C)  SpO2: 98%    Body mass index is 24.55 kg/m.   Physical Examination:  Physical Exam Constitutional:      General: She is not in acute distress. HENT:     Right Ear: Tympanic membrane, ear canal and external ear normal.  Left Ear: Tympanic membrane, ear canal and external ear normal.  Eyes:     General: No scleral icterus.    Extraocular Movements: Extraocular movements intact.     Conjunctiva/sclera: Conjunctivae normal.  Cardiovascular:     Rate and Rhythm: Normal rate and regular rhythm.     Pulses: Normal pulses.     Heart sounds: Normal heart sounds.  Pulmonary:     Effort: Pulmonary effort is normal. No respiratory distress.     Breath sounds: Normal breath sounds.  Abdominal:     General: Bowel sounds are normal. There is no distension.     Palpations: Abdomen is soft.  Musculoskeletal:        General: Normal range of motion.     Cervical back: Normal range of motion and neck supple.     Right lower leg: No edema.     Left lower leg: No edema.  Lymphadenopathy:     Cervical: No cervical adenopathy.  Skin:    General: Skin is warm and dry.  Neurological:     Mental Status: She is alert and oriented to person, place, and time.  Psychiatric:        Mood and Affect: Mood normal.        Behavior: Behavior normal.        Thought Content: Thought content normal.    ASSESSMENT and PLAN: This visit occurred during the SARS-CoV-2 public health emergency.  Safety protocols were in place, including screening questions prior to the visit, additional usage of staff PPE, and extensive cleaning of exam room while observing appropriate contact time as indicated for disinfecting solutions.   Taiesha was seen today for establish care.  Diagnoses and all orders  for this visit:  Preventative health care Bring copy of Advanced directive. Schedule appt for mammogram. Return to influenza and pneumonia vaccine in September. Get shingrix and TDAP vaccine from retail pharmacy. Look up easy posture stretch to help improve your posture: www.braceability.com   We reviewed lab results completed in 01/2021 by previous pcp. There is no need for repeat labs today.  Problem List Items Addressed This Visit   None Visit Diagnoses     Preventative health care    -  Primary       Follow up: Return in about 1 year (around 07/16/2022) for CPE (fasting).  Wilfred Lacy, NP

## 2021-08-06 ENCOUNTER — Other Ambulatory Visit: Payer: Self-pay | Admitting: Gastroenterology

## 2021-08-10 ENCOUNTER — Encounter: Payer: Medicare Other | Admitting: Nurse Practitioner

## 2021-09-13 DIAGNOSIS — L718 Other rosacea: Secondary | ICD-10-CM | POA: Diagnosis not present

## 2021-09-13 DIAGNOSIS — D225 Melanocytic nevi of trunk: Secondary | ICD-10-CM | POA: Diagnosis not present

## 2021-09-13 DIAGNOSIS — L821 Other seborrheic keratosis: Secondary | ICD-10-CM | POA: Diagnosis not present

## 2021-09-13 DIAGNOSIS — L249 Irritant contact dermatitis, unspecified cause: Secondary | ICD-10-CM | POA: Diagnosis not present

## 2021-09-13 DIAGNOSIS — B351 Tinea unguium: Secondary | ICD-10-CM | POA: Diagnosis not present

## 2021-09-13 DIAGNOSIS — L814 Other melanin hyperpigmentation: Secondary | ICD-10-CM | POA: Diagnosis not present

## 2021-09-13 DIAGNOSIS — L570 Actinic keratosis: Secondary | ICD-10-CM | POA: Diagnosis not present

## 2021-09-15 IMAGING — MG DIGITAL SCREENING BILAT W/ CAD
4 series · 4 of 4 positions shown · non-contrast
Comparison: Previous exam(s).

CLINICAL DATA: Screening.

EXAM:
DIGITAL SCREENING BILATERAL MAMMOGRAM WITH CAD

[R CC]
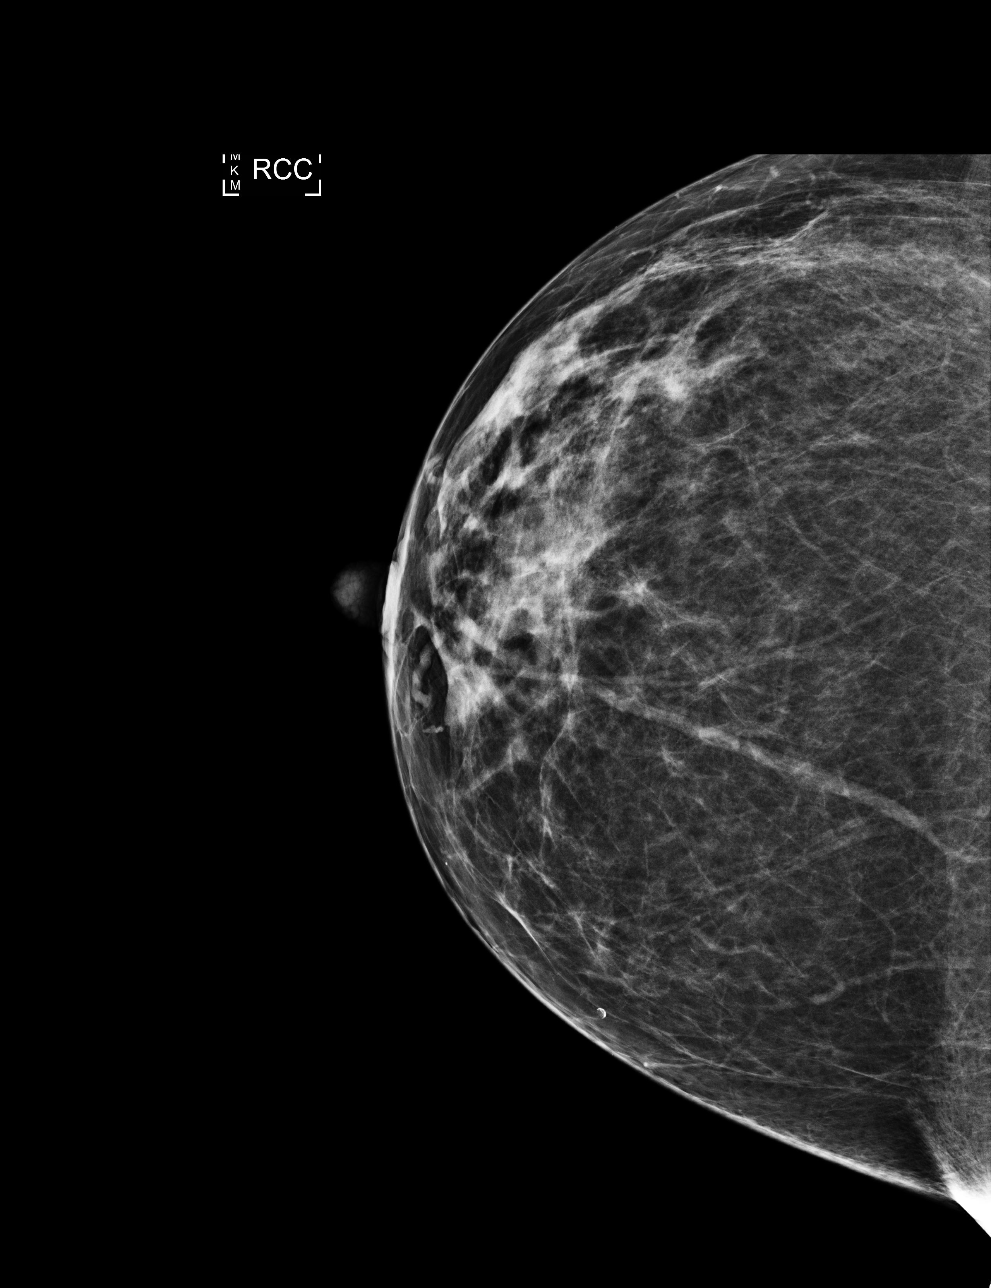

[L MLO]
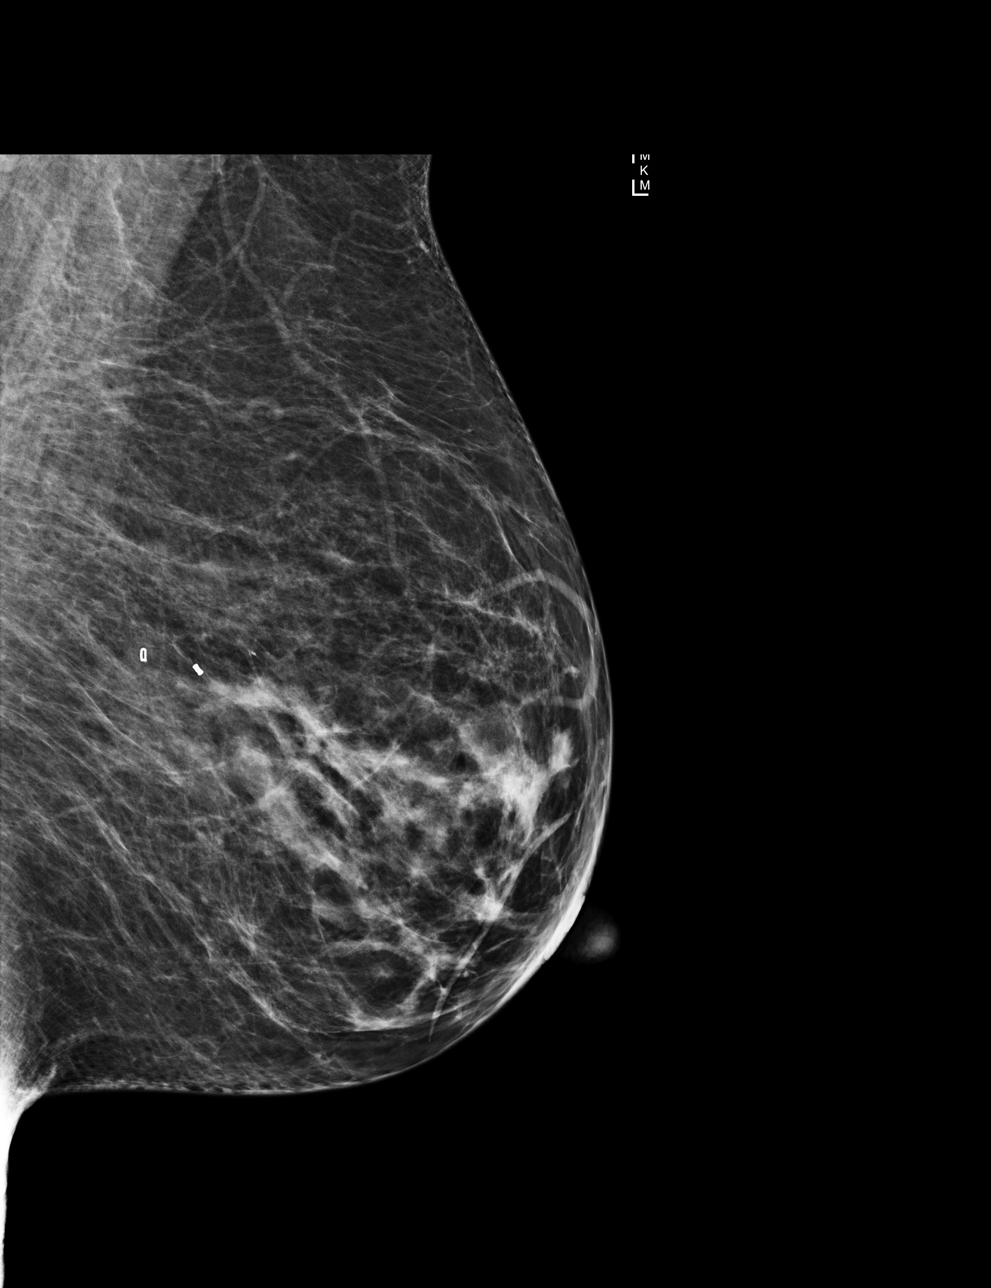

[R MLO]
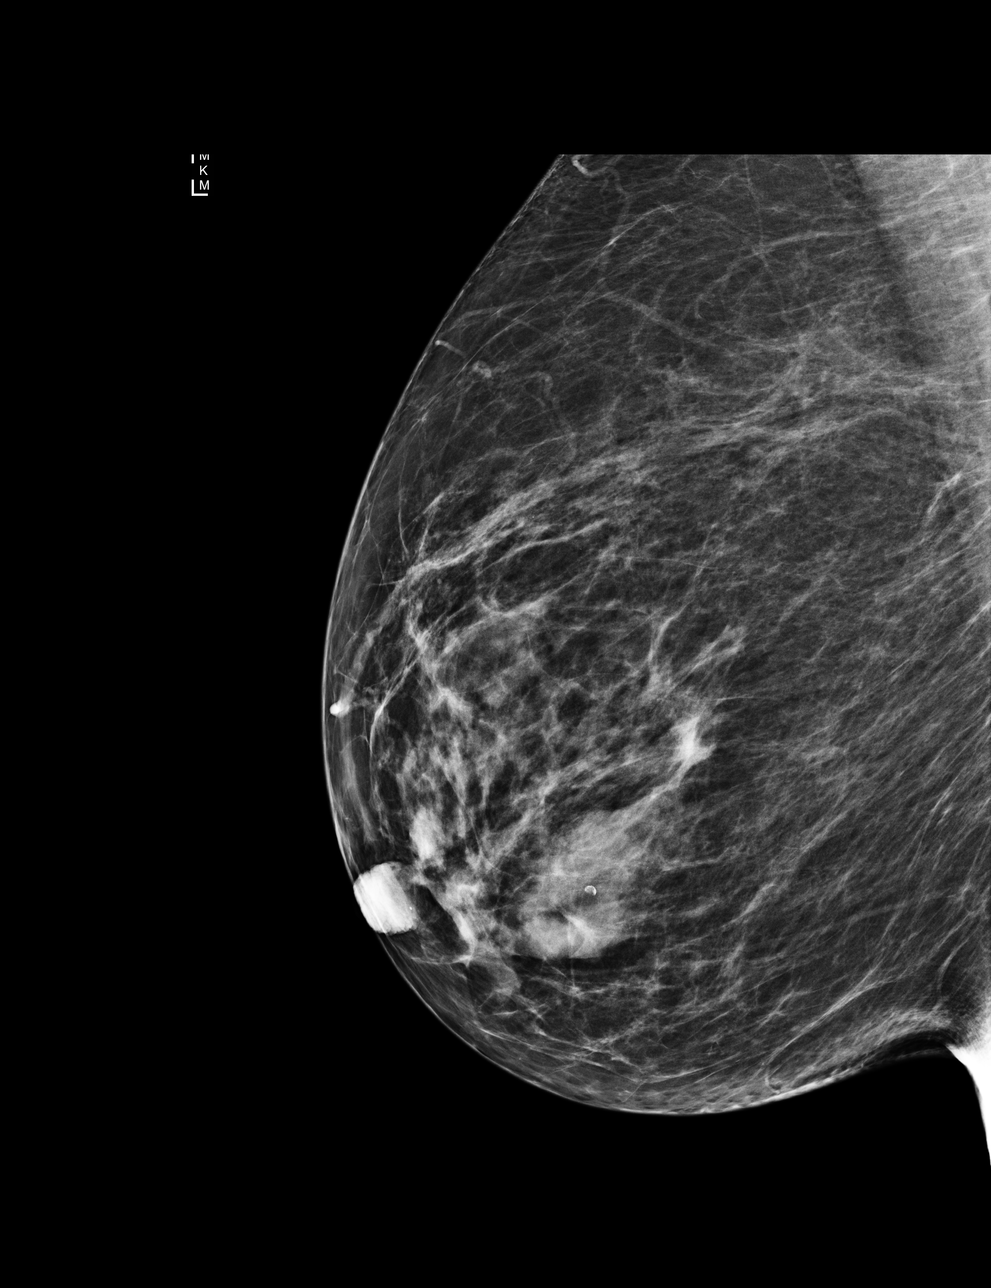

[L CC]
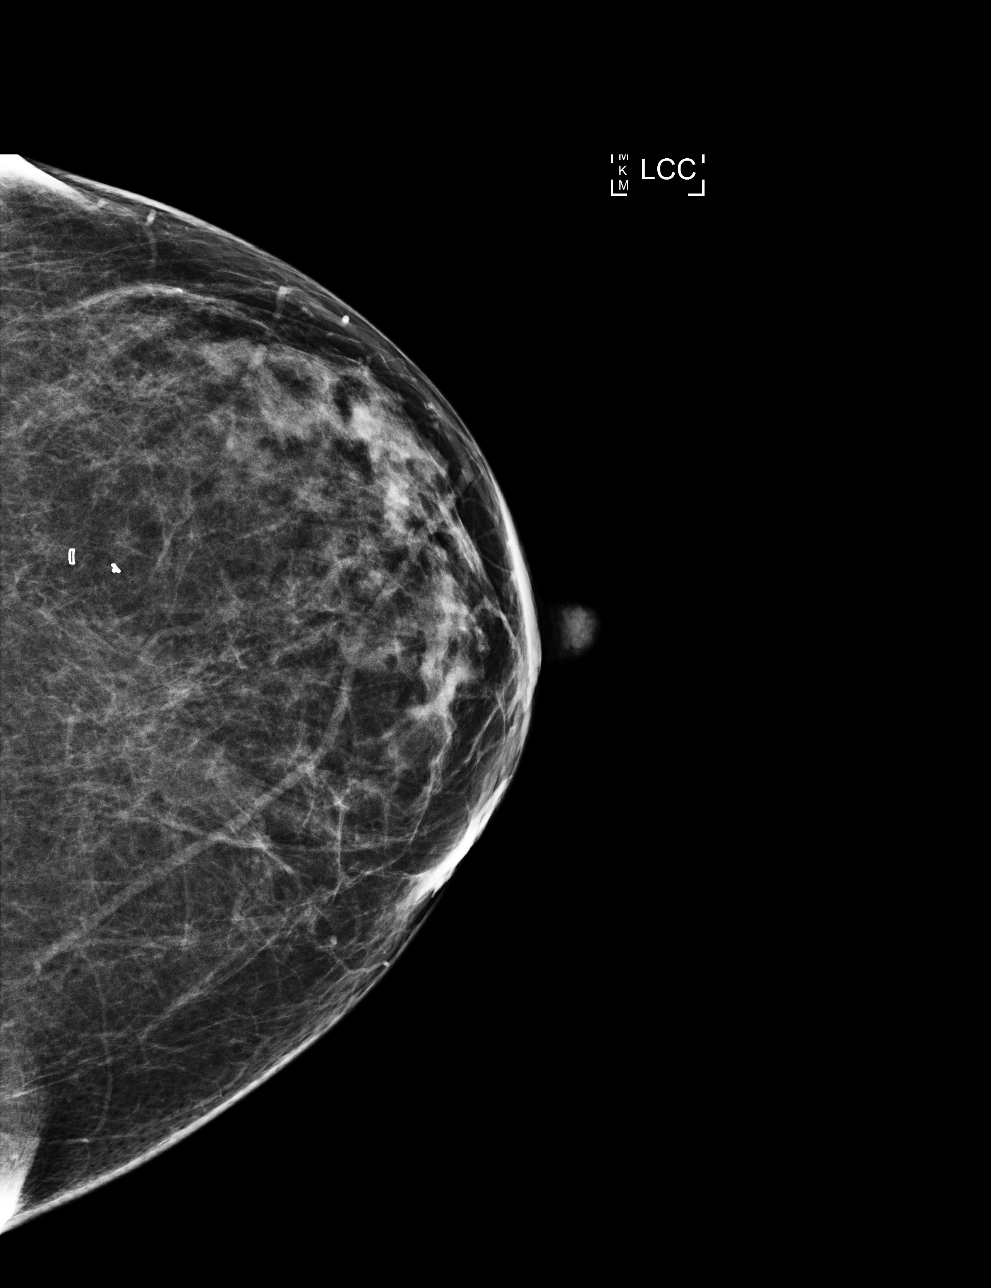

[4 of 4 positions shown; findings below may reference images not displayed]

ACR Breast Density Category b: There are scattered areas of
fibroglandular density.
FINDINGS: There are no findings suspicious for malignancy. Images were
processed with CAD.
IMPRESSION: No mammographic evidence of malignancy. A result letter of this
screening mammogram will be mailed directly to the patient.

RECOMMENDATION:
Screening mammogram in one year. (Code:AS-G-LCT)

BI-RADS CATEGORY  1: Negative.

## 2021-09-24 ENCOUNTER — Encounter: Payer: Self-pay | Admitting: Nurse Practitioner

## 2021-10-20 ENCOUNTER — Other Ambulatory Visit: Payer: Self-pay | Admitting: Nurse Practitioner

## 2021-10-20 DIAGNOSIS — Z1231 Encounter for screening mammogram for malignant neoplasm of breast: Secondary | ICD-10-CM

## 2021-11-01 ENCOUNTER — Ambulatory Visit: Payer: Medicare Other | Admitting: Dermatology

## 2021-11-25 ENCOUNTER — Ambulatory Visit
Admission: RE | Admit: 2021-11-25 | Discharge: 2021-11-25 | Disposition: A | Discharge status: Home / Self Care | Payer: Medicare Other | Source: Ambulatory Visit | Attending: Nurse Practitioner | Admitting: Nurse Practitioner

## 2021-11-25 DIAGNOSIS — Z1231 Encounter for screening mammogram for malignant neoplasm of breast: Secondary | ICD-10-CM

## 2021-12-09 ENCOUNTER — Other Ambulatory Visit: Payer: Self-pay | Admitting: Nurse Practitioner

## 2021-12-09 DIAGNOSIS — Z780 Asymptomatic menopausal state: Secondary | ICD-10-CM

## 2021-12-11 ENCOUNTER — Encounter (HOSPITAL_BASED_OUTPATIENT_CLINIC_OR_DEPARTMENT_OTHER): Payer: Self-pay | Admitting: *Deleted

## 2021-12-11 ENCOUNTER — Emergency Department (HOSPITAL_BASED_OUTPATIENT_CLINIC_OR_DEPARTMENT_OTHER)
Admission: EM | Admit: 2021-12-11 | Discharge: 2021-12-11 | Disposition: A | Discharge status: Home / Self Care | Payer: Medicare Other | Attending: Emergency Medicine | Admitting: Emergency Medicine

## 2021-12-11 ENCOUNTER — Emergency Department (HOSPITAL_BASED_OUTPATIENT_CLINIC_OR_DEPARTMENT_OTHER): Payer: Medicare Other

## 2021-12-11 ENCOUNTER — Other Ambulatory Visit: Payer: Self-pay

## 2021-12-11 DIAGNOSIS — W1830XA Fall on same level, unspecified, initial encounter: Secondary | ICD-10-CM | POA: Insufficient documentation

## 2021-12-11 DIAGNOSIS — S0990XA Unspecified injury of head, initial encounter: Secondary | ICD-10-CM | POA: Diagnosis not present

## 2021-12-11 DIAGNOSIS — S0993XA Unspecified injury of face, initial encounter: Secondary | ICD-10-CM | POA: Diagnosis not present

## 2021-12-11 MED ORDER — IBUPROFEN 400 MG PO TABS
600.0000 mg | ORAL_TABLET | Freq: Once | ORAL | Status: AC
  Administered 2021-12-11: 600 mg via ORAL
  Filled 2021-12-11: qty 1

## 2021-12-11 NOTE — ED Notes (Signed)
ED Provider at bedside. 

## 2021-12-11 NOTE — ED Triage Notes (Signed)
Pt reports a bookcase collapsed on her head on Jan 5. Denies LOC. States she was fine until Monday when she had a headache. Reports soreness where the bookcase hit her head. States she wants to be checked out. Alert, NAD

## 2021-12-11 NOTE — ED Provider Notes (Signed)
[facility] EMERGENCY DEPARTMENT Provider Note   CSN: [id number] Arrival date & time: 12/11/21  1303     History  Chief Complaint  Patient presents with   Head Injury    Crystal Blair is a 71 y.o. female.  Pt is a 71 yo wf with a hx of arthritis and ulcerative colitis.  She is not on blood thinners.  Pt said she was putting together a book case and it fell on her head.  This was on 1/5.  Pt denies loc.  She said it hurt really badly at first.  Pain went away, but then came back.  She denies n/v.  No dizziness.  She also has pain to the right side of her face.      Home Medications Prior to Admission medications   Medication Sig Start Date End Date Taking? Authorizing Provider  mesalamine (APRISO) 0.375 g 24 hr capsule TAKE 4 CAPSULES BY MOUTH DAILY 08/06/21   Cirigliano, Vito V, DO      Allergies    Penicillin g and Mango flavor    Review of Systems   Review of Systems  HENT:         Facial pain  Neurological:  Positive for headaches.  All other systems reviewed and are negative.  Physical Exam Updated Vital Signs BP 121/80 (BP Location: Left Arm)    Pulse 80    Temp 97.8 F (36.6 C) (Oral)    Resp 16    Ht 5\' 4"  (1.626 m)    Wt 65.3 kg    SpO2 100%    BMI 24.72 kg/m  Physical Exam Vitals and nursing note reviewed.  Constitutional:      Appearance: Normal appearance.  HENT:     Head: Normocephalic.      Right Ear: External ear normal.     Left Ear: External ear normal.     Nose: Nose normal.     Mouth/Throat:     Mouth: Mucous membranes are moist.     Pharynx: Oropharynx is clear.  Eyes:     Extraocular Movements: Extraocular movements intact.     Conjunctiva/sclera: Conjunctivae normal.     Pupils: Pupils are equal, round, and reactive to light.  Cardiovascular:     Rate and Rhythm: Normal rate and regular rhythm.     Pulses: Normal pulses.     Heart sounds: Normal heart sounds.  Pulmonary:     Effort: Pulmonary effort is normal.      Breath sounds: Normal breath sounds.  Abdominal:     General: Abdomen is flat. Bowel sounds are normal.     Palpations: Abdomen is soft.  Musculoskeletal:        General: Normal range of motion.     Cervical back: Normal range of motion and neck supple.  Skin:    General: Skin is warm.     Capillary Refill: Capillary refill takes less than 2 seconds.  Neurological:     General: No focal deficit present.     Mental Status: She is alert and oriented to person, place, and time.  Psychiatric:        Mood and Affect: Mood normal.        Behavior: Behavior normal.    ED Results / Procedures / Treatments   Labs (all labs ordered are listed, but only abnormal results are displayed) Labs Reviewed - No data to display  EKG None  Radiology CT Head Wo Contrast  Result Date: 12/11/2021 CLINICAL  DATA:  Head trauma EXAM: CT HEAD WITHOUT CONTRAST TECHNIQUE: Contiguous axial images were obtained from the base of the skull through the vertex without intravenous contrast. RADIATION DOSE REDUCTION: This exam was performed according to the departmental dose-optimization program which includes automated exposure control, adjustment of the mA and/or kV according to patient size and/or use of iterative reconstruction technique. COMPARISON:  None. FINDINGS: Brain: No acute intracranial hemorrhage, mass effect, or herniation. No extra-axial fluid collections. No evidence of acute territorial infarct. No hydrocephalus. Mild cortical volume loss and mild chronic microvascular ischemic changes. Vascular: No hyperdense vessel or unexpected calcification. Skull: Normal. Negative for fracture or focal lesion. Sinuses/Orbits: No acute finding. Other: None. IMPRESSION: Chronic changes with no acute intracranial process identified. Electronically Signed   By: Ofilia Neas M.D.   On: 12/11/2021 14:27   CT Maxillofacial Wo Contrast  Result Date: 12/11/2021 CLINICAL DATA:  Facial trauma EXAM: CT MAXILLOFACIAL  WITHOUT CONTRAST TECHNIQUE: Multidetector CT imaging of the maxillofacial structures was performed. Multiplanar CT image reconstructions were also generated. RADIATION DOSE REDUCTION: This exam was performed according to the departmental dose-optimization program which includes automated exposure control, adjustment of the mA and/or kV according to patient size and/or use of iterative reconstruction technique. COMPARISON:  None. FINDINGS: Osseous: No fracture or mandibular dislocation. No destructive process. Advanced degenerative changes of the cervical spine noted. Orbits: Negative. No traumatic or inflammatory finding. Sinuses: Clear. Soft tissues: Negative. Limited intracranial: No significant or unexpected finding. IMPRESSION: No acute facial fracture identified. Electronically Signed   By: Ofilia Neas M.D.   On: 12/11/2021 14:29    Procedures Procedures    Medications Ordered in ED Medications  ibuprofen (ADVIL) tablet 600 mg (600 mg Oral Given 12/11/21 1343)    ED Course/ Medical Decision Making/ A&P                           Medical Decision Making Amount and/or Complexity of Data Reviewed Radiology: ordered.   Due to continued headache and right sided facial pain after trauma, I ordered a CT head and face.  Pt's CTs do not show anything acute.    Pt is instructed to alternate tylenol and ibuprofen for pain.  She is to return if worse.  She is to f/u with pcp.         Final Clinical Impression(s) / ED Diagnoses Final diagnoses:  Injury of head, initial encounter    Rx / DC Orders ED Discharge Orders     None         Isla Pence, MD 12/11/21 1444

## 2021-12-29 ENCOUNTER — Other Ambulatory Visit: Payer: Medicare Other

## 2021-12-30 ENCOUNTER — Other Ambulatory Visit: Payer: Self-pay

## 2021-12-30 ENCOUNTER — Emergency Department (HOSPITAL_BASED_OUTPATIENT_CLINIC_OR_DEPARTMENT_OTHER)
Admission: EM | Admit: 2021-12-30 | Discharge: 2021-12-30 | Disposition: A | Discharge status: Home / Self Care | Payer: Medicare Other | Attending: Emergency Medicine | Admitting: Emergency Medicine

## 2021-12-30 ENCOUNTER — Encounter (HOSPITAL_BASED_OUTPATIENT_CLINIC_OR_DEPARTMENT_OTHER): Payer: Self-pay

## 2021-12-30 DIAGNOSIS — Z23 Encounter for immunization: Secondary | ICD-10-CM | POA: Insufficient documentation

## 2021-12-30 DIAGNOSIS — S01111A Laceration without foreign body of right eyelid and periocular area, initial encounter: Secondary | ICD-10-CM | POA: Insufficient documentation

## 2021-12-30 DIAGNOSIS — S01112A Laceration without foreign body of left eyelid and periocular area, initial encounter: Secondary | ICD-10-CM | POA: Diagnosis not present

## 2021-12-30 DIAGNOSIS — W19XXXA Unspecified fall, initial encounter: Secondary | ICD-10-CM | POA: Insufficient documentation

## 2021-12-30 DIAGNOSIS — M25562 Pain in left knee: Secondary | ICD-10-CM | POA: Insufficient documentation

## 2021-12-30 DIAGNOSIS — S0993XA Unspecified injury of face, initial encounter: Secondary | ICD-10-CM | POA: Diagnosis present

## 2021-12-30 DIAGNOSIS — S0181XA Laceration without foreign body of other part of head, initial encounter: Secondary | ICD-10-CM

## 2021-12-30 MED ORDER — TETANUS-DIPHTH-ACELL PERTUSSIS 5-2.5-18.5 LF-MCG/0.5 IM SUSY
0.5000 mL | PREFILLED_SYRINGE | Freq: Once | INTRAMUSCULAR | Status: AC
  Administered 2021-12-30: 0.5 mL via INTRAMUSCULAR
  Filled 2021-12-30: qty 0.5

## 2021-12-30 NOTE — ED Triage Notes (Signed)
Pt states she tripped/fell ~1hour PTA-pain to left knee and lac to right eyebrow from glasses-denies LOC-NAD-steady gait

## 2021-12-30 NOTE — ED Notes (Signed)
Pt in bed, pt has a small lac and wound to her R orbit/temple pt denies loc, pt awake and oriented times three.

## 2021-12-30 NOTE — ED Provider Notes (Signed)
[facility] Provider Note   CSN: [id number] Arrival date & time: 12/30/21  1226     History  Chief Complaint  Patient presents with   Crystal Blair is a 71 y.o. female here for evaluation of mechanical fall. Occurred just PTA. Hit glasses on right side of wall. Denies vision changes, HA, neck pain, back pain, CP, SOB, Abd pain, numbness, weakness. Some pain to left knee, she is S/p lef knee arthroplasty. She has been able to ambulate without difficulty. Some mild swelling to left knee. Full ROM without difficulty. Abrasions here and tetanus > 20 years ago. Does NOT want any imaging. States she will FU with ortho outpatient.  No anticoagulation, LOC  HPI     Home Medications Prior to Admission medications   Medication Sig Start Date End Date Taking? Authorizing Provider  mesalamine (APRISO) 0.375 g 24 hr capsule TAKE 4 CAPSULES BY MOUTH DAILY 08/06/21   Cirigliano, Vito V, DO      Allergies    Penicillin g and Mango flavor    Review of Systems   Review of Systems  Constitutional: Negative.   HENT: Negative.    Respiratory: Negative.    Cardiovascular: Negative.   Gastrointestinal: Negative.   Genitourinary: Negative.   Musculoskeletal: Negative.        Left knee swelling  Skin:  Positive for wound.  Neurological: Negative.   All other systems reviewed and are negative.  Physical Exam Updated Vital Signs BP (!) 143/96 (BP Location: Right Arm)    Pulse 84    Temp 98 F (36.7 C) (Oral)    Resp 16    SpO2 100%  Physical Exam Vitals and nursing note reviewed.  Constitutional:      General: She is not in acute distress.    Appearance: She is well-developed. She is not ill-appearing, toxic-appearing or diaphoretic.  HENT:     Head: Normocephalic.     Comments: 1.5 cm superficial laceration to right eyebrow. 20mm abrasion to left eye brow. No hematoma    Nose: Nose normal.     Mouth/Throat:     Mouth: Mucous membranes are  moist.     Comments: No loose teeth Eyes:     Extraocular Movements: Extraocular movements intact.     Conjunctiva/sclera: Conjunctivae normal.     Pupils: Pupils are equal, round, and reactive to light.     Comments: No traumatic hyphema. EOM intact  Neck:     Trachea: Trachea and phonation normal.     Comments: No midline cervical tenderness Cardiovascular:     Rate and Rhythm: Normal rate.     Pulses: Normal pulses.          Radial pulses are 2+ on the right side and 2+ on the left side.     Heart sounds: Normal heart sounds.     Comments: Heart clear Pulmonary:     Effort: No respiratory distress.     Breath sounds: Normal breath sounds and air entry.     Comments: Clear bil Chest:     Comments: Non tender Abdominal:     General: Bowel sounds are normal. There is no distension.     Palpations: Abdomen is soft.     Tenderness: There is no abdominal tenderness.     Comments: soft  Musculoskeletal:        General: Normal range of motion.     Cervical back: Full passive range of motion without pain  and normal range of motion.     Comments: No midline C/T/L tenderness.  No bony tenderness to bilateral upper extremities, pelvis stable, nontender palpation.  No bony tenderness of bilateral femur, tib-fib, feet.  Mild tenderness left knee.  Able to flex and extend all major joints without difficulty.  Skin:    General: Skin is warm and dry.  Neurological:     General: No focal deficit present.     Mental Status: She is alert.  Psychiatric:        Mood and Affect: Mood normal.    ED Results / Procedures / Treatments   Labs (all labs ordered are listed, but only abnormal results are displayed) Labs Reviewed - No data to display  EKG None  Radiology No results found.  Procedures .Marland KitchenLaceration Repair  Date/Time: 12/30/2021 3:19 PM Performed by: Shelby Dubin A, PA-C Authorized by: Nettie Elm, PA-C   Consent:    Consent obtained:  Verbal   Consent given by:   Patient   Risks discussed:  Infection, need for additional repair, pain, poor cosmetic result and poor wound healing   Alternatives discussed:  No treatment and delayed treatment Universal protocol:    Procedure explained and questions answered to patient or proxy's satisfaction: yes     Relevant documents present and verified: yes     Test results available: yes     Imaging studies available: yes     Required blood products, implants, devices, and special equipment available: yes     Site/side marked: yes     Immediately prior to procedure, a time out was called: yes     Patient identity confirmed:  Verbally with patient Anesthesia:    Anesthesia method:  None Laceration details:    Location:  Face   Face location:  R eyebrow   Length (cm):  1.5   Depth (mm):  3 Pre-procedure details:    Preparation:  Patient was prepped and draped in usual sterile fashion (patient declined imaging) Exploration:    Hemostasis achieved with:  Direct pressure   Imaging obtained comment:  Patient declined imaging   Wound exploration: wound explored through full range of motion and entire depth of wound visualized     Wound extent: no muscle damage noted, no nerve damage noted, no tendon damage noted and no vascular damage noted     Contaminated: no   Skin repair:    Repair method:  Tissue adhesive Approximation:    Approximation:  Loose Repair type:    Repair type:  Intermediate Post-procedure details:    Dressing:  Open (no dressing)   Procedure completion:  Tolerated well, no immediate complications    Medications Ordered in ED Medications  Tdap (BOOSTRIX) injection 0.5 mL (0.5 mLs Intramuscular Given 12/30/21 1520)    ED Course/ Medical Decision Making/ A&P    71 year old here for evaluation of mechanical fall which occurred just PTA.  She is 1.5 cm superficial laceration to her right eyebrow.  She is neurovascularly intact.  She has been ambulatory since.  Does admit to a prior left knee  arthrocentesis with some overlying swelling however has full range of motion, compartments are soft.  Has overlying abrasion.  No midline C/T/L tenderness.  No anticoagulation, LOC.  Recommended with the patient CT head given head injury as well as x-rays however patient declines.  We discussed risk versus benefit-obtain imaging including fracture, dislocation, brain bleed, death, permanent limb, organ damage.  Patient and significant other in room voiced understanding  of risk versus benefit and declined imaging. They understand that by declining imaging at this is [place]. They state they have a concert to attend to and are on a tight timeline and do not have time for this. There was also concern with patient as she had a fall not too long ago that required head imaging and she does not want too much radiation.  We will update her tetanus.  With regards to her laceration this was cleaned, Dermabond was used for closure.  Discussed close follow-up with PCP, orthopedics.  She voiced understanding and is ambulatory here without difficulty.  The patient has been appropriately medically screened and/or stabilized in the ED. I have low suspicion for any other emergent medical condition which would require further screening, evaluation or treatment in the ED or require inpatient management.  Patient is hemodynamically stable and in no acute distress.  Patient able to ambulate in department prior to ED.  Evaluation does not show acute pathology that would require ongoing or additional emergent interventions while in the emergency department or further inpatient treatment.  I have discussed the diagnosis with the patient and answered all questions.  Pain is been managed while in the emergency department and patient has no further complaints prior to discharge.  Patient is comfortable with plan discussed in room and is stable for discharge at this time.  I have discussed strict return precautions for  returning to the emergency department.  Patient was encouraged to follow-up with PCP/specialist refer to at discharge.                           Medical Decision Making Amount and/or Complexity of Data Reviewed Independent Historian: spouse  Risk OTC drugs. Risk Details: Risk: Patient declined CT imaging and x-rays to assess for bleed, fracture, dislocation           Final Clinical Impression(s) / ED Diagnoses Final diagnoses:  Fall, initial encounter  Facial laceration, initial encounter    Rx / DC Orders ED Discharge Orders     None         [place] Rico A, PA-C 12/30/21 1526    Wyvonnia Dusky, MD 12/30/21 1812

## 2021-12-30 NOTE — Discharge Instructions (Signed)
Return if you change your mind about imaging.  Tylenol and Ibuprofen as needed for pain  Follow up with Primary care and Orthopedics  Dermabond adhesive should flake off over the next week

## 2022-01-21 ENCOUNTER — Other Ambulatory Visit: Payer: Medicare Other

## 2022-02-06 ENCOUNTER — Other Ambulatory Visit: Payer: Self-pay | Admitting: Gastroenterology

## 2022-03-03 DIAGNOSIS — Z96652 Presence of left artificial knee joint: Secondary | ICD-10-CM | POA: Diagnosis not present

## 2022-03-15 ENCOUNTER — Telehealth: Payer: Self-pay | Admitting: Gastroenterology

## 2022-03-15 ENCOUNTER — Other Ambulatory Visit: Payer: Self-pay | Admitting: Gastroenterology

## 2022-03-15 NOTE — Telephone Encounter (Signed)
Patient called requesting a med refill for apriso. Last OV 01/26/21, I advised patient in order to continue med refills we would need to schedule OV. Patient is now scheduled for an OV 03/23/22. Per patient, only has one day left of apriso medication. Please advise. ?

## 2022-03-16 MED ORDER — MESALAMINE ER 0.375 G PO CP24: 1.5000 g | ORAL_CAPSULE | Freq: Every day | ORAL | 1 refills | Status: DC

## 2022-03-16 NOTE — Telephone Encounter (Signed)
Done

## 2022-03-23 ENCOUNTER — Ambulatory Visit (INDEPENDENT_AMBULATORY_CARE_PROVIDER_SITE_OTHER): Payer: Medicare Other | Admitting: Gastroenterology

## 2022-03-23 ENCOUNTER — Encounter: Payer: Self-pay | Admitting: Gastroenterology

## 2022-03-23 VITALS — BP 126/82 | HR 81 | Ht 64.0 in | Wt 153.4 lb

## 2022-03-23 DIAGNOSIS — Z86010 Personal history of colonic polyps: Secondary | ICD-10-CM | POA: Diagnosis not present

## 2022-03-23 DIAGNOSIS — K50119 Crohn's disease of large intestine with unspecified complications: Secondary | ICD-10-CM

## 2022-03-23 DIAGNOSIS — K641 Second degree hemorrhoids: Secondary | ICD-10-CM

## 2022-03-23 DIAGNOSIS — K649 Unspecified hemorrhoids: Secondary | ICD-10-CM

## 2022-03-23 DIAGNOSIS — R109 Unspecified abdominal pain: Secondary | ICD-10-CM | POA: Diagnosis not present

## 2022-03-23 MED ORDER — MESALAMINE ER 0.375 G PO CP24: 1.5000 g | ORAL_CAPSULE | Freq: Every day | ORAL | 5 refills | Status: DC

## 2022-03-23 MED ORDER — HYOSCYAMINE SULFATE 0.125 MG SL SUBL: 0.1250 mg | SUBLINGUAL_TABLET | Freq: Four times a day (QID) | SUBLINGUAL | 3 refills | Status: DC | PRN

## 2022-03-23 NOTE — Patient Instructions (Addendum)
If you are age 69 or older, your body mass index should be between 23-30. Your Body mass index is 26.33 kg/m?Marland Kitchen If this is out of the aforementioned range listed, please consider follow up with your Primary Care Provider. ? ?__________________________________________________________ ? ?The [facility] providers would like to encourage you to use [facility] to communicate with providers for non-urgent requests or questions.  Due to long hold times on the telephone, sending your provider a message by [facility] may be a faster and more efficient way to get a response.  Please allow 48 business hours for a response.  Please remember that this is for non-urgent requests.   ? ?Due to recent changes in healthcare laws, you may see the results of your imaging and laboratory studies on MyChart before your provider has had a chance to review them.  We understand that in some cases there may be results that are confusing or concerning to you. Not all laboratory results come back in the same time frame and the provider may be waiting for multiple results in order to interpret others.  Please give [place] 48 hours in order for your provider to thoroughly review all the results before contacting the office for clarification of your results.   ? ?We have sent the following medications to your pharmacy for you to pick up at your convenience: Apriso, Levsin ? ? ?Thank you for choosing me and [facility]. ? ?Gerrit Heck, D.O. ? ?We want to thank you for trusting [facility] with your care. All of our staff and providers value the relationships we have built with our patients, and it is an honor to care for you.  ? ?We are writing to let you know that [facility] will close on Apr 04, 2022, and we invite you to continue to see Dr. Carmell Austria and Gerrit Heck at the [facility] [place] office location. We are consolidating our serices at these [facility] practices to  better provide care. Our office staff will work with you to ensure a seamless transition.  ? ?Gerrit Heck, DO -Dr. Bryan Lemma will be movig to [facility] at [address], [place], [place] [address], effective Apr 04, 2022.  Contact [phone] to schedule an appointment with him.  ? ?Carmell Austria, MD- Dr. Lyndel Safe will be movig to [facility] at [address], [place], [place] [address], effective Apr 04, 2022.  Contact [phone] to schedule an appointment with him.  ? ?Requesting Medical Records ?If you need to request your medical records, please follow the instructions below. Your medical records are confidential, and a copy can be transferred to another provider or released to you or another person you designate only with your permission. ? ?There are several ways to request your medical records: ?Requests for medical records can be submitted through our practice.   ?You can also request your records electronically, in your MyChart account by selecting the ?Request Health Records? tab.  ?If you need additional information on how to request records, please go to [email or web address]/, choose Patient Information, then select Request Medical Records. ?To make an appointment or if you have any questions about your health care needs, please contact our office at [phone] and one of our staff members will be glad to assist you. ?[facility] is committed to providing exceptional care for you and our community. Thank you for allowing [place] to serve your health care needs. ?Sincerely, ? ?Windy Canny, Director [facility] ?[facility] also  offers convenient virtual care options. Sore throat? Sinus problems? Cold or flu symptoms? Get care from the comfort of home with [facility] Video Visits and e-Visits. Learn more about the non-emergency conditions treated and start your virtual visit at [email or web address]  ?

## 2022-03-23 NOTE — Progress Notes (Signed)
? ?Chief Complaint:    Medication refill, symptomatic hemorrhoids ? ?GI History: 71 year old female with history of colon polyps, diverticulosis, hemorrhoids.  ?  ?Diagnosed with Segmental Colitis Associated with Diverticulosis (SCAD) on colonoscopy 07/2020. Started on Apriso with good clinical response-decreased stool frequency, improved consistency. ?  ?Separately, longstanding history of symptomatic hemorrhoids, described as episodic BRBPR, not completely resolved with adequate treatment of SCAD as above. ?- 01/26/2021: Banding of RA hemorrhoid ?  ?Family history notable for maternal aunt with CRC > age 98 and son with Crohn's Disease ?  ?  ?Endoscopic history: ?-Multiple colonoscopies in [place] prior to moving to [place].  Started colonoscopies approx age 71, notable for polyps at that time, and has subsequently had frequent surveillance colonoscopy, with a couple of polyps since then.  Also hx of internal hemorrhoids and diverticulosis. ?-Last colonoscopy in [place] approximate 2016 and notable for tubular adenoma with recommendation repeat in 5 years. ?-Colonoscopy (08/06/2020, Dr. Bryan Lemma): 2 sessile serrated polyps in transverse and ascending colon, nonbleeding cecal AVM, localized moderate gastritis in sigmoid colon 20-30 cm from anal verge (path: Diverticular Associated Colitis), sigmoid and ascending diverticulosis, grade 2 hemorrhoids.  Normal TI.  Repeat 5 years ? ?HPI:   ? ? ?Patient is a 71 y.o. female presenting to the Gastroenterology Clinic for follow-up.  Last seen by me on 01/26/2021.  At that time,  SCAD well-controlled with Apriso.  Still with symptomatic hemorrhoids with intermittent BRBPR.  Performed banding of RA hemorrhoid with plan to follow-up in 4 weeks for continued hemorrhoid band ligation.  She subsequently had left TKR and never sought follow-up for continued banding. ? ?Requesting RF of Apriso. SCAD sxs well controlled. Occasional abdominal cramping, which tends to be nocturnal. ? ?Still with  intermittent BRBPR attributed to her hemorrhoids.  She would like to continue hemorrhoid banding, but not today.  Will call to schedule an appointment. ? ? ? ?Review of systems:     No chest pain, no SOB, no fevers, no urinary sx  ? ?Past Medical History:  ?Diagnosis Date  ? Arthritis   ? Diverticulosis   ? History of chicken pox   ? History of gallstones   ? Tubular adenoma 2015  ? Per Colonoscopy/q59yr  ? [place]   ? ? ?Patient's surgical history, family medical history, social history, medications and allergies were all reviewed in Epic  ? ? ?Current Outpatient Medications  ?Medication Sig Dispense Refill  ? Bioflavonoid Products (VITAMIN C) CHEW Chew 1 tablet by mouth daily in the afternoon.    ? mesalamine (APRISO) 0.375 g 24 hr capsule Take 4 capsules (1.5 g total) by mouth daily. 120 capsule 1  ? Multiple Vitamin (MULTIVITAMIN) tablet Take 1 tablet by mouth daily.    ? ?No current facility-administered medications for this visit.  ? ? ?Physical Exam:   ? ? ?Pulse 81   Ht '5\' 4"'$  (1.626 m)   Wt 153 lb 6 oz (69.6 kg)   BMI 26.33 kg/m?  ? ?GENERAL:  Pleasant female in NAD ?PSYCH: : Cooperative, normal affect ?Musculoskeletal:  Normal muscle tone, normal strength ?NEURO: Alert and oriented x 3, no focal neurologic deficits ? ? ?IMPRESSION and PLAN:   ? ?#1.  Symptomatic internal hemorrhoids: ?- Patient will call to schedule appointment for continued hemorrhoid band ligation ?- Continue conservative management in the interim ? ? ?#2.  Segmental Colitis Associated with Diverticulosis (SCAD) ?#3. Abdominal cramping ?- Symptoms much improved since starting Apriso ?- Placed Rx for Apriso refill today ?-  Preserved renal function on serial BMP checks.  Due for repeat check in August ?- Levsin 0.125 mg prn Q6H for abdominal cramping ?  ?#4. Sessile Serrated Polyp ?-2 subcentimeter SSP's on colonoscopy ?-Repeat colonoscopy 2026 for ongoing polyp surveillance ? ? ? ?    ? ?Crystal Blair ,DO, FACG  03/23/2022, 2:06 PM ? ?

## 2022-04-26 ENCOUNTER — Ambulatory Visit
Admission: RE | Admit: 2022-04-26 | Discharge: 2022-04-26 | Disposition: A | Discharge status: Home / Self Care | Payer: Medicare Other | Source: Ambulatory Visit | Attending: Nurse Practitioner | Admitting: Nurse Practitioner

## 2022-04-26 DIAGNOSIS — Z780 Asymptomatic menopausal state: Secondary | ICD-10-CM | POA: Diagnosis not present

## 2022-04-26 DIAGNOSIS — M8589 Other specified disorders of bone density and structure, multiple sites: Secondary | ICD-10-CM | POA: Diagnosis not present

## 2022-04-28 ENCOUNTER — Telehealth: Payer: Self-pay | Admitting: Nurse Practitioner

## 2022-04-28 NOTE — Telephone Encounter (Signed)
Pt is wanting a cal back concerning her most recent  DG BONE DENSITY (DXA) (Accession [id number]) (Order [id number].   Please advise at  [phone]

## 2022-04-28 NOTE — Telephone Encounter (Signed)
Pt advised & voiced understanding

## 2022-04-28 NOTE — Telephone Encounter (Signed)
Left VM, adv pt to call back  

## 2022-07-12 ENCOUNTER — Ambulatory Visit (INDEPENDENT_AMBULATORY_CARE_PROVIDER_SITE_OTHER): Payer: Medicare Other

## 2022-07-12 DIAGNOSIS — Z0000 Encounter for general adult medical examination without abnormal findings: Secondary | ICD-10-CM

## 2022-07-12 NOTE — Progress Notes (Signed)
Subjective:   Crystal Blair is a 71 y.o. female who presents for Medicare Annual (Subsequent) preventive examination.   I connected with Crystal Blair  today by telephone and verified that I am speaking with the correct person using two identifiers. Location patient: home Location provider: work Persons participating in the virtual visit: patient, provider.   I discussed the limitations, risks, security and privacy concerns of performing an evaluation and management service by telephone and the availability of in person appointments. I also discussed with the patient that there may be a patient responsible charge related to this service. The patient expressed understanding and verbally consented to this telephonic visit.    Interactive audio and video telecommunications were attempted between this provider and patient, however failed, due to patient having technical difficulties OR patient did not have access to video capability.  We continued and completed visit with audio only.    Review of Systems     Cardiac Risk Factors include: advanced age (>7mn, >>51women)     Objective:    Today's Vitals   There is no height or weight on file to calculate BMI.     07/12/2022    8:28 AM 12/30/2021   12:42 PM 12/11/2021    1:10 PM 07/06/2021    9:22 AM 06/25/2020    8:42 AM  Advanced Directives  Does Patient Have a Medical Advance Directive? Yes Yes Yes Yes No  Type of AParamedicof ALittle EagleLiving will   [place] Chart? No - copy requested   No - copy requested   Would patient like information on creating a medical advance directive?     Yes (MAU/Ambulatory/Procedural Areas - Information given)    Current Medications (verified) Outpatient Encounter Medications as of 07/12/2022  Medication Sig   Bioflavonoid Products (VITAMIN C) CHEW Chew 1 tablet by mouth daily in the afternoon.   hyoscyamine  (LEVSIN SL) 0.125 MG SL tablet Place 1 tablet (0.125 mg total) under the tongue every 6 (six) hours as needed.   mesalamine (APRISO) 0.375 g 24 hr capsule Take 4 capsules (1.5 g total) by mouth daily.   Multiple Vitamin (MULTIVITAMIN) tablet Take 1 tablet by mouth daily.   No facility-administered encounter medications on file as of 07/12/2022.    Allergies (verified) Penicillin g and Mango flavor   History: Past Medical History:  Diagnosis Date   Arthritis    Diverticulosis    History of chicken pox    History of gallstones    Tubular adenoma 2015   Per Colonoscopy/q592yr  [place]ative colitis ([place]   Past Surgical History:  Procedure Laterality Date   BREAST BIOPSY Left 2015   BREAST EXCISIONAL BIOPSY Left    CHOLECYSTECTOMY  1991   COLONOSCOPY     around 2016   ESOPHAGOGASTRODUODENOSCOPY     around 1993. 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   RETINAL TEAR REPAIR CRYOTHERAPY Right 05/18/2020   Family History  Problem Relation Age of Onset   Arthritis Mother    COPD Mother    Depression Mother    Alcohol abuse Father    Early death Father [id number] Hyperlipidemia Father    Hypertension Father    Hypertension Sister    Bladder Cancer Sister    Crohn's disease Son    Colon cancer Maternal Aunt    Esophageal cancer Neg Hx    Social  History   Socioeconomic History   Marital status: Widowed    Spouse name: Not on file   Number of children: 2   Years of education: Not on file   Highest education level: Not on file  Occupational History   Occupation: works part time  Tobacco Use   Smoking status: Never   Smokeless tobacco: Never  Vaping Use   Vaping Use: Never used  Substance and Sexual Activity   Alcohol use: Yes    Comment: occasional   Drug use: Never   Sexual activity: Yes  Other Topics Concern   Not on file  Social History Narrative   Not on file   Social Determinants of Health   Financial Resource Strain: Low Risk  (07/12/2022)    Overall Financial Resource Strain (CARDIA)    Difficulty of Paying Living Expenses: Not hard at all  Food Insecurity: No Food Insecurity (07/12/2022)   Hunger Vital Sign    Worried About Running Out of Food in the Last Year: Never true    [place] in the Last Year: Never true  Transportation Needs: No Transportation Needs (07/12/2022)   PRAPARE - Hydrologist (Medical): No    Lack of Transportation (Non-Medical): No  Physical Activity: Insufficiently Active (07/12/2022)   Exercise Vital Sign    Days of Exercise per Week: 3 days    Minutes of Exercise per Session: 30 min  Stress: No Stress Concern Present (07/12/2022)   [place]    Feeling of Stress : Not at all  Social Connections: Moderately Integrated (07/12/2022)   Social Connection and Isolation Panel [NHANES]    Frequency of Communication with Friends and Family: Three times a week    Frequency of Social Gatherings with Friends and Family: Three times a week    Attends Religious Services: Never    Active Member of Clubs or Organizations: Yes    Attends Archivist Meetings: 1 to 4 times per year    Marital Status: Living with partner    Tobacco Counseling Counseling given: Not Answered   Clinical Intake:  Pre-visit preparation completed: Yes  Pain : No/denies pain     Nutritional Risks: None Diabetes: No  How often do you need to have someone help you when you read instructions, pamphlets, or other written materials from your doctor or pharmacy?: 1 - Never What is the last grade level you completed in school?: college  Diabetic?no   Interpreter Needed?: No  Information entered by :: L.Wilson,LPN   Activities of Daily Living    07/12/2022    8:29 AM 07/11/2022   10:38 AM  In your present state of health, do you have any difficulty performing the following activities:  Hearing? 0 0  Vision? 0 0   Difficulty concentrating or making decisions? 0 0  Walking or climbing stairs? 0 0  Dressing or bathing? 0 0  Doing errands, shopping? 0 0  Preparing Food and eating ? N N  Using the Toilet? N N  In the past six months, have you accidently leaked urine? N N  Do you have problems with loss of bowel control? N N  Managing your Medications? N N  Managing your Finances? N N  Housekeeping or managing your Housekeeping? N N    Patient Care Team: Nche, Charlene Brooke, NP as PCP - General (Internal Medicine) Lavonna Monarch, MD as Consulting Physician (Dermatology)  Indicate any recent  Medical Services you may have received from other than [facility] providers in the past year (date may be approximate).     Assessment:   This is a routine wellness examination for Crystal Blair.  Hearing/Vision screen Vision Screening - Comments:: Annual eye wears glasses  Dietary issues and exercise activities discussed: Current Exercise Habits: Home exercise routine, Type of exercise: walking;strength training/weights, Time (Minutes): 30, Frequency (Times/Week): 3, Weekly Exercise (Minutes/Week): 90, Intensity: Mild, Exercise limited by: orthopedic condition(s)   Goals Addressed   None    Depression Screen    07/12/2022    8:29 AM 07/12/2022    8:26 AM 07/16/2021   11:00 AM 07/06/2021    9:21 AM 06/25/2020    8:47 AM 09/11/2019    8:56 AM 01/31/2019   12:00 PM  PHQ 2/9 Scores  PHQ - 2 Score 0 0 0 0 0 0 1  PHQ- 9 Score   1   0 3    Fall Risk    07/12/2022    8:30 AM 07/11/2022   10:38 AM 07/06/2021    8:55 AM 06/25/2020    8:45 AM 01/31/2019   12:00 PM  [place] in the past year? 0 1 0 1 0  Number falls in past yr: 0 0 0 0   Injury with Fall? 0 1 0 1   Risk for fall due to :    History of fall(s)   Follow up Falls evaluation completed;Education provided  Falls evaluation completed;Falls prevention discussed Falls prevention discussed Falls evaluation completed    FALL RISK PREVENTION PERTAINING TO  THE HOME:  Any stairs in or around the home? No  If so, are there any without handrails? No  Home free of loose throw rugs in walkways, pet beds, electrical cords, etc? Yes  Adequate lighting in your home to reduce risk of falls? Yes   ASSISTIVE DEVICES UTILIZED TO PREVENT FALLS:  Life alert? No  Use of a cane, walker or w/c? No  Grab bars in the bathroom? Yes  Shower chair or bench in shower? No  Elevated toilet seat or a handicapped toilet? No    Cognitive Function:        07/12/2022    8:37 AM  6CIT Screen  What Year? 0 points  What month? 0 points  What time? 0 points  Count back from 20 0 points  Months in reverse 0 points  Repeat phrase 0 points  Total Score 0 points  Normal cognitive status assessed by telephone conversation  by this Nurse Health Advisor. No abnormalities found.    Immunizations Immunization History  Administered Date(s) Administered   Fluad Quad(high Dose 65+) 07/31/2019   Influenza-Unspecified 10/19/2020, 08/09/2021   PFIZER(Purple Top)SARS-COV-2 Vaccination 01/12/2020, 02/05/2020, 09/01/2020, 08/09/2021   Pfizer Covid-19 Vaccine Bivalent Booster 19yr & up 08/09/2021   Pneumococcal Conjugate-13 09/11/2019   Tdap 12/30/2021    TDAP status: Up to date  Flu Vaccine status: Up to date  Pneumococcal vaccine status: Up to date  Covid-19 vaccine status: Completed vaccines  Qualifies for Shingles Vaccine? Yes   Zostavax completed No   Shingrix Completed?: No.    Education has been provided regarding the importance of this vaccine. Patient has been advised to call insurance company to determine out of pocket expense if they have not yet received this vaccine. Advised may also receive vaccine at local pharmacy or Health Dept. Verbalized acceptance and understanding.  Screening Tests Health Maintenance  Topic Date Due   Zoster  Vaccines- Shingrix (1 of 2) Never done   Pneumonia Vaccine 77+ Years old (2 - PPSV23 or PCV20) 09/10/2020   COVID-19  Vaccine (5 - Pfizer risk series) 10/04/2021   INFLUENZA VACCINE  06/21/2022   MAMMOGRAM  11/25/2022   COLONOSCOPY (Pts 45-60yr Insurance coverage will need to be confirmed)  08/06/2025   TETANUS/TDAP  12/31/2031   DEXA SCAN  Completed   Hepatitis C Screening  Completed   HPV VACCINES  Aged Out    Health Maintenance  Health Maintenance Due  Topic Date Due   Zoster Vaccines- Shingrix (1 of 2) Never done   Pneumonia Vaccine 69 Years old (2 - PPSV23 or PCV20) 09/10/2020   COVID-19 Vaccine (5 - Pfizer risk series) 10/04/2021   INFLUENZA VACCINE  06/21/2022    Colorectal cancer screening: Type of screening: Colonoscopy. Completed 08/06/2020. Repeat every 5 years  Mammogram status: Completed 11/25/2021. Repeat every year  Bone Density status: Completed 04/26/2022. Results reflect: Bone density results: OSTEOPENIA. Repeat every 5 years.  Lung Cancer Screening: (Low Dose CT Chest recommended if Age 69-80years, 30 pack-year currently smoking OR have quit w/in 15years.) does not qualify.   Lung Cancer Screening Referral: n/a  Additional Screening:  Hepatitis C Screening: does not qualify; C  Vision Screening: Recommended annual ophthalmology exams for early detection of glaucoma and other disorders of the eye. Is the patient up to date with their annual eye exam?  Yes  Who is the provider or what is the name of the [facility] which the patient attends annual [facility] exams[facility] provider, would they like to be referred to a provider to establish care? No .   Dental Screening: Recommended annual dental exams for proper oral hygiene  Community Resource Referral / Chronic Care Management: CRR required this visit?  No   CCM required this visit?  No      Plan:     I have personally reviewed and noted the following in the patient's chart:   Medical and social history Use of alcohol, tobacco or illicit drugs  Current medications and  supplements including opioid prescriptions. Patient is not currently taking opioid prescriptions. Functional ability and status Nutritional status Physical activity Advanced directives List of other physicians Hospitalizations, surgeries, and ER visits in previous 12 months Vitals Screenings to include cognitive, depression, and falls Referrals and appointments  In addition, I have reviewed and discussed with patient certain preventive protocols, quality metrics, and best practice recommendations. A written personalized care plan for preventive services as well as general preventive health recommendations were provided to patient.     LDaphane Shepherd LPN   [id number]  Nurse Notes: none

## 2022-07-12 NOTE — Patient Instructions (Signed)
Crystal Blair , Thank you for taking time to come for your Medicare Wellness Visit. I appreciate your ongoing commitment to your health goals. Please review the following plan we discussed and let me know if I can assist you in the future.   Screening recommendations/referrals: Colonoscopy: 08/06/2020  due 07/2025 Mammogram: 11/25/2021 Bone Density: 04/26/2022 Recommended yearly ophthalmology/optometry visit for glaucoma screening and checkup Recommended yearly dental visit for hygiene and checkup  Vaccinations: Influenza vaccine: completed  Pneumococcal vaccine: completed  Tdap vaccine: 12/30/2021 Shingles vaccine: Will Consider     Advanced directives: yes   Conditions/risks identified: none   Next appointment: none none    Preventive Care 65 Years and Older, Female Preventive care refers to lifestyle choices and visits with your health care provider that can promote health and wellness. What does preventive care include? A yearly physical exam. This is also called an annual well check. Dental exams once or twice a year. Routine eye exams. Ask your health care provider how often you should have your eyes checked. Personal lifestyle choices, including: Daily care of your teeth and gums. Regular physical activity. Eating a healthy diet. Avoiding tobacco and drug use. Limiting alcohol use. Practicing safe sex. Taking low-dose aspirin every day. Taking vitamin and mineral supplements as recommended by your health care provider. What happens during an annual well check? The services and screenings done by your health care provider during your annual well check will depend on your age, overall health, lifestyle risk factors, and family history of disease. Counseling  Your health care provider may ask you questions about your: Alcohol use. Tobacco use. Drug use. Emotional well-being. Home and relationship well-being. Sexual activity. Eating habits. History of falls. Memory  and ability to understand (cognition). Work and work Statistician. Reproductive health. Screening  You may have the following tests or measurements: Height, weight, and BMI. Blood pressure. Lipid and cholesterol levels. These may be checked every 5 years, or more frequently if you are over 47 years old. Skin check. Lung cancer screening. You may have this screening every year starting at age 85 if you have a 30-pack-year history of smoking and currently smoke or have quit within the past 15 years. Fecal occult blood test (FOBT) of the stool. You may have this test every year starting at age 90. Flexible sigmoidoscopy or colonoscopy. You may have a sigmoidoscopy every 5 years or a colonoscopy every 10 years starting at age 94. Hepatitis C blood test. Hepatitis B blood test. Sexually transmitted disease (STD) testing. Diabetes screening. This is done by checking your blood sugar (glucose) after you have not eaten for a while (fasting). You may have this done every 1-3 years. Bone density scan. This is done to screen for osteoporosis. You may have this done starting at age 65. Mammogram. This may be done every 1-2 years. Talk to your health care provider about how often you should have regular mammograms. Talk with your health care provider about your test results, treatment options, and if necessary, the need for more tests. Vaccines  Your health care provider may recommend certain vaccines, such as: Influenza vaccine. This is recommended every year. Tetanus, diphtheria, and acellular pertussis (Tdap, Td) vaccine. You may need a Td booster every 10 years. Zoster vaccine. You may need this after age 59. Pneumococcal 13-valent conjugate (PCV13) vaccine. One dose is recommended after age 50. Pneumococcal polysaccharide (PPSV23) vaccine. One dose is recommended after age 79. Talk to your health care provider about which screenings and vaccines you  need and how often you need them. This  information is not intended to replace advice given to you by your health care provider. Make sure you discuss any questions you have with your health care provider. Document Released: 12/04/2015 Document Revised: 07/27/2016 Document Reviewed: 09/08/2015 Elsevier Interactive Patient Education  2017 [place] Prevention in the Home Falls can cause injuries. They can happen to people of all ages. There are many things you can do to make your home safe and to help prevent falls. What can I do on the outside of my home? Regularly fix the edges of walkways and driveways and fix any cracks. Remove anything that might make you trip as you walk through a door, such as a raised step or threshold. Trim any bushes or trees on the path to your home. Use bright outdoor lighting. Clear any walking paths of anything that might make someone trip, such as rocks or tools. Regularly check to see if handrails are loose or broken. Make sure that both sides of any steps have handrails. Any raised decks and porches should have guardrails on the edges. Have any leaves, snow, or ice cleared regularly. Use sand or salt on walking paths during winter. Clean up any spills in your garage right away. This includes oil or grease spills. What can I do in the bathroom? Use night lights. Install grab bars by the toilet and in the tub and shower. Do not use towel bars as grab bars. Use non-skid mats or decals in the tub or shower. If you need to sit down in the shower, use a plastic, non-slip stool. Keep the floor dry. Clean up any water that spills on the floor as soon as it happens. Remove soap buildup in the tub or shower regularly. Attach bath mats securely with double-sided non-slip rug tape. Do not have throw rugs and other things on the floor that can make you trip. What can I do in the bedroom? Use night lights. Make sure that you have a light by your bed that is easy to reach. Do not use any sheets or  blankets that are too big for your bed. They should not hang down onto the floor. Have a firm chair that has side arms. You can use this for support while you get dressed. Do not have throw rugs and other things on the floor that can make you trip. What can I do in the kitchen? Clean up any spills right away. Avoid walking on wet floors. Keep items that you use a lot in easy-to-reach places. If you need to reach something above you, use a strong step stool that has a grab bar. Keep electrical cords out of the way. Do not use floor polish or wax that makes floors slippery. If you must use wax, use non-skid floor wax. Do not have throw rugs and other things on the floor that can make you trip. What can I do with my stairs? Do not leave any items on the stairs. Make sure that there are handrails on both sides of the stairs and use them. Fix handrails that are broken or loose. Make sure that handrails are as long as the stairways. Check any carpeting to make sure that it is firmly attached to the stairs. Fix any carpet that is loose or worn. Avoid having throw rugs at the top or bottom of the stairs. If you do have throw rugs, attach them to the floor with carpet tape. Make sure that  you have a light switch at the top of the stairs and the bottom of the stairs. If you do not have them, ask someone to add them for you. What else can I do to help prevent falls? Wear shoes that: Do not have high heels. Have rubber bottoms. Are comfortable and fit you well. Are closed at the toe. Do not wear sandals. If you use a stepladder: Make sure that it is fully opened. Do not climb a closed stepladder. Make sure that both sides of the stepladder are locked into place. Ask someone to hold it for you, if possible. Clearly mark and make sure that you can see: Any grab bars or handrails. First and last steps. Where the edge of each step is. Use tools that help you move around (mobility aids) if they are  needed. These include: Canes. Walkers. Scooters. Crutches. Turn on the lights when you go into a dark area. Replace any light bulbs as soon as they burn out. Set up your furniture so you have a clear path. Avoid moving your furniture around. If any of your floors are uneven, fix them. If there are any pets around you, be aware of where they are. Review your medicines with your doctor. Some medicines can make you feel dizzy. This can increase your chance of falling. Ask your doctor what other things that you can do to help prevent falls. This information is not intended to replace advice given to you by your health care provider. Make sure you discuss any questions you have with your health care provider. Document Released: 09/03/2009 Document Revised: 04/14/2016 Document Reviewed: 12/12/2014 Elsevier Interactive Patient Education  2017 [employer].

## 2022-07-20 ENCOUNTER — Ambulatory Visit (INDEPENDENT_AMBULATORY_CARE_PROVIDER_SITE_OTHER): Payer: Medicare Other | Admitting: Nurse Practitioner

## 2022-07-20 ENCOUNTER — Encounter: Payer: Self-pay | Admitting: Nurse Practitioner

## 2022-07-20 ENCOUNTER — Ambulatory Visit (INDEPENDENT_AMBULATORY_CARE_PROVIDER_SITE_OTHER): Payer: Medicare Other

## 2022-07-20 VITALS — BP 130/80 | HR 71 | Temp 96.9°F | Ht 64.0 in | Wt 149.4 lb

## 2022-07-20 DIAGNOSIS — Z8669 Personal history of other diseases of the nervous system and sense organs: Secondary | ICD-10-CM | POA: Insufficient documentation

## 2022-07-20 DIAGNOSIS — Z0001 Encounter for general adult medical examination with abnormal findings: Secondary | ICD-10-CM | POA: Diagnosis not present

## 2022-07-20 DIAGNOSIS — J0100 Acute maxillary sinusitis, unspecified: Secondary | ICD-10-CM | POA: Diagnosis not present

## 2022-07-20 DIAGNOSIS — M25511 Pain in right shoulder: Secondary | ICD-10-CM

## 2022-07-20 DIAGNOSIS — G8929 Other chronic pain: Secondary | ICD-10-CM

## 2022-07-20 DIAGNOSIS — Z23 Encounter for immunization: Secondary | ICD-10-CM | POA: Diagnosis not present

## 2022-07-20 DIAGNOSIS — E7800 Pure hypercholesterolemia, unspecified: Secondary | ICD-10-CM | POA: Diagnosis not present

## 2022-07-20 LAB — COMPREHENSIVE METABOLIC PANEL
ALT: 19 U/L (ref 0–35)
AST: 20 U/L (ref 0–37)
Albumin: 4.1 g/dL (ref 3.5–5.2)
Alkaline Phosphatase: 53 U/L (ref 39–117)
BUN: 14 mg/dL (ref 6–23)
CO2: 29 mEq/L (ref 19–32)
Calcium: 9.8 mg/dL (ref 8.4–10.5)
Chloride: 106 mEq/L (ref 96–112)
Creatinine, Ser: 0.61 mg/dL (ref 0.40–1.20)
GFR: 91.68 mL/min (ref >60.00)
Glucose, Bld: 89 mg/dL (ref 70–99)
Potassium: 5.3 mEq/L — ABNORMAL HIGH (ref 3.5–5.1)
Sodium: 142 mEq/L (ref 135–145)
Total Bilirubin: 1 mg/dL (ref 0.2–1.2)
Total Protein: 7.4 g/dL (ref 6.0–8.3)

## 2022-07-20 LAB — CBC
HCT: 41.4 % (ref 36.0–46.0)
Hemoglobin: 13.7 g/dL (ref 12.0–15.0)
MCHC: 33.1 g/dL (ref 30.0–36.0)
MCV: 93 fl (ref 78.0–100.0)
Platelets: 284 10*3/uL (ref 150.0–400.0)
RBC: 4.45 Mil/uL (ref 3.87–5.11)
RDW: 13.8 % (ref 11.5–15.5)
WBC: 4.9 10*3/uL (ref 4.0–10.5)

## 2022-07-20 LAB — LIPID PANEL
Cholesterol: 199 mg/dL (ref 0–200)
HDL: 67.7 mg/dL (ref >39.00)
LDL Cholesterol: 116 mg/dL — ABNORMAL HIGH (ref 0–99)
NonHDL: 130.96
Total CHOL/HDL Ratio: 3
Triglycerides: 73 mg/dL (ref 0.0–149.0)
VLDL: 14.6 mg/dL (ref 0.0–40.0)

## 2022-07-20 MED ORDER — FLUTICASONE PROPIONATE 50 MCG/ACT NA SUSP: 2.0000 | Freq: Every day | NASAL | 0 refills | Status: DC

## 2022-07-20 MED ORDER — AZELASTINE HCL 0.1 % NA SOLN: 1.0000 | Freq: Two times a day (BID) | NASAL | 0 refills | Status: DC

## 2022-07-20 MED ORDER — DICLOFENAC SODIUM 1 % EX GEL: 2.0000 g | Freq: Four times a day (QID) | CUTANEOUS | 0 refills | Status: DC

## 2022-07-20 NOTE — Assessment & Plan Note (Signed)
Repeat lipid panel ?

## 2022-07-20 NOTE — Progress Notes (Unsigned)
Initial visit for RT shoulder pain. Pt states she had the pain before a fall 6 months ago. After the fall she noticed the pain started getting worse.

## 2022-07-20 NOTE — Progress Notes (Signed)
Complete physical exam  Patient: Crystal Blair   DOB: 1950-12-07   71 y.o. Female  MRN: [id number] Visit Date: 07/20/2022  Subjective:    Chief Complaint  Patient presents with   Annual Exam    CPE Pt fasting  Rt shoulder pain  Would like a referral to podiatry, GYN Pneumonia Vaccine today    Crystal Blair is a 71 y.o. female who presents today for a complete physical exam. She reports consuming a low fat and low sodium diet.  No exercise  She generally feels well. She reports sleeping well. She does have additional problems to discuss today.  Vision:No Dental:No STD Screen:No  Wt Readings from Last 3 Encounters:  07/20/22 149 lb 6.4 oz (67.8 kg)  03/23/22 153 lb 6 oz (69.6 kg)  12/11/21 144 lb (65.3 kg)    Most recent fall risk assessment:    07/20/2022    8:26 AM  [place] in the past year? 0  Number falls in past yr: 0  Injury with Fall? 0     Most recent depression screenings:    07/20/2022    9:25 AM 07/12/2022    8:29 AM  PHQ 2/9 Scores  PHQ - 2 Score 0 0  PHQ- 9 Score 2    Sinus Problem This is a chronic problem. The current episode started more than 1 month ago. The problem has been waxing and waning since onset. There has been no fever. Associated symptoms include congestion and sinus pressure. Pertinent negatives include no chills, coughing, diaphoresis, ear pain, headaches, hoarse voice, neck pain, shortness of breath, sneezing, sore throat or swollen glands. Treatments tried: cool mist humidifier. The treatment provided mild relief.  Shoulder Pain  The pain is present in the right shoulder. This is a chronic problem. The current episode started more than 1 month ago. There has been a history of trauma. The problem occurs constantly. The problem has been unchanged. The quality of the pain is described as aching and dull. Associated symptoms include joint locking. Pertinent negatives include no fever, inability to bear weight, itching or  joint swelling. The symptoms are aggravated by activity and lying down. She has tried rest for the symptoms. Family history does not include gout or rheumatoid arthritis. Her past medical history is significant for osteoarthritis. There is no history of diabetes, gout or rheumatoid arthritis.  Reports Repetitive lifting at [place], about 35lbs containers and repetive moping. Reports Fall 12/2021: tripped on [place], shoulder soreness after fall, thinks she hit right shoulder during fall. Waxing and waning Worse laying on right shoulder and using  shoulder. Has crepitus. Mild weakness and limited ROM No paresthesia. Will like referral to [facility] if needed.  No problem-specific Assessment & Plan notes found for this encounter.  Past Medical History:  Diagnosis Date   Arthritis    Diverticulosis    History of chicken pox    History of gallstones    Tubular adenoma 2015   Per Colonoscopy/q39yr   Ulcerative colitis ([place]    Past Surgical History:  Procedure Laterality Date   BREAST BIOPSY Left 2015   BREAST EXCISIONAL BIOPSY Left    CHOLECYSTECTOMY  1991   COLONOSCOPY     around 2016   ESOPHAGOGASTRODUODENOSCOPY     around 1993. 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   RETINAL TEAR REPAIR CRYOTHERAPY Right 05/18/2020   Social History   Socioeconomic History   Marital status: Widowed  Spouse name: Not on file   Number of children: 2   Years of education: Not on file   Highest education level: Not on file  Occupational History   Occupation: works part time  Tobacco Use   Smoking status: Never   Smokeless tobacco: Never  Vaping Use   Vaping Use: Never used  Substance and Sexual Activity   Alcohol use: Yes    Comment: occasional   Drug use: Never   Sexual activity: Yes  Other Topics Concern   Not on file  Social History Narrative   Not on file   Social Determinants of Health   Financial Resource Strain: Low Risk  (07/12/2022)   Overall  Financial Resource Strain (CARDIA)    Difficulty of Paying Living Expenses: Not hard at all  Food Insecurity: No Food Insecurity (07/12/2022)   Hunger Vital Sign    Worried About Running Out of Food in the Last Year: Never true    [place] in the Last Year: Never true  Transportation Needs: No Transportation Needs (07/12/2022)   PRAPARE - Hydrologist (Medical): No    Lack of Transportation (Non-Medical): No  Physical Activity: Insufficiently Active (07/12/2022)   Exercise Vital Sign    Days of Exercise per Week: 3 days    Minutes of Exercise per Session: 30 min  Stress: No Stress Concern Present (07/12/2022)   [place]    Feeling of Stress : Not at all  Social Connections: Moderately Integrated (07/12/2022)   Social Connection and Isolation Panel [NHANES]    Frequency of Communication with Friends and Family: Three times a week    Frequency of Social Gatherings with Friends and Family: Three times a week    Attends Religious Services: Never    Active Member of Clubs or Organizations: Yes    Attends Archivist Meetings: 1 to 4 times per year    Marital Status: Living with partner  Intimate Partner Violence: Not At Risk (07/12/2022)   Humiliation, Afraid, Rape, and Kick questionnaire    Fear of Current or Ex-Partner: No    Emotionally Abused: No    Physically Abused: No    Sexually Abused: No   Family Status  Relation Name Status   Mother  Deceased   Father  Deceased   Sister Charlott Rakes   Son  Alive   MGM  Deceased   MGF  Deceased   PGM  Deceased   PGF  Deceased   Son  Ecologist  (Not Specified)   Neg Hx  (Not Specified)   Family History  Problem Relation Age of Onset   Arthritis Mother    COPD Mother    Depression Mother    Alcohol abuse Father    Early death Father 22   Hyperlipidemia Father    Hypertension Father    Hypertension Sister     Bladder Cancer Sister    Crohn's disease Son    Colon cancer Maternal Aunt    Esophageal cancer Neg Hx    Allergies  Allergen Reactions   Penicillin G Other (See Comments)    As a kid had a reaction    Mango Flavor     Patient Care Team: Mireille Lacombe, Charlene Brooke, NP as PCP - General (Internal Medicine) Lavonna Monarch, MD as Consulting Physician (Dermatology)   Medications: Outpatient Medications Prior to Visit  Medication Sig   Bioflavonoid Products (  VITAMIN C) CHEW Chew 1 tablet by mouth daily in the afternoon.   hyoscyamine (LEVSIN SL) 0.125 MG SL tablet Place 1 tablet (0.125 mg total) under the tongue every 6 (six) hours as needed.   mesalamine (APRISO) 0.375 g 24 hr capsule Take 4 capsules (1.5 g total) by mouth daily.   Multiple Vitamin (MULTIVITAMIN) tablet Take 1 tablet by mouth daily.   No facility-administered medications prior to visit.   Review of Systems  Constitutional:  Negative for chills, diaphoresis and fever.  HENT:  Positive for congestion, nosebleeds, sinus pressure and sinus pain. Negative for dental problem, ear pain, hearing loss, hoarse voice, postnasal drip, rhinorrhea, sneezing, sore throat and tinnitus.   Respiratory:  Negative for cough and shortness of breath.   Musculoskeletal:  Positive for arthralgias. Negative for back pain, gait problem, gout, joint swelling, myalgias, neck pain and neck stiffness.  Skin: Negative.  Negative for itching.  Neurological:  Negative for headaches.       Objective:  BP 130/80 (BP Location: Right Arm, Patient Position: Sitting, Cuff Size: Small)   Pulse 71   Temp (!) 96.9 F (36.1 C) (Temporal)   Ht '5\' 4"'$  (1.626 m)   Wt 149 lb 6.4 oz (67.8 kg)   SpO2 96%   BMI 25.64 kg/m       Physical Exam Vitals reviewed.  Constitutional:      General: She is not in acute distress.    Appearance: She is well-developed. She is obese.  HENT:     Right Ear: Tympanic membrane, ear canal and external ear normal.     Left  Ear: Tympanic membrane, ear canal and external ear normal.     Nose: Congestion present. No rhinorrhea.  Eyes:     Extraocular Movements: Extraocular movements intact.     Conjunctiva/sclera: Conjunctivae normal.  Neck:     Thyroid: No thyroid mass, thyromegaly or thyroid tenderness.  Cardiovascular:     Rate and Rhythm: Normal rate and regular rhythm.     Pulses: Normal pulses.     Heart sounds: Normal heart sounds.  Pulmonary:     Effort: Pulmonary effort is normal. No respiratory distress.     Breath sounds: Normal breath sounds.  Chest:     Chest wall: No tenderness.  Abdominal:     General: Bowel sounds are normal.     Palpations: Abdomen is soft.  Musculoskeletal:        General: Tenderness present.     Right shoulder: Tenderness and crepitus present. No swelling, deformity, effusion, laceration or bony tenderness. Decreased range of motion. Decreased strength. Normal pulse.     Left shoulder: Normal.     Right upper arm: Normal.     Left upper arm: Normal.     Right elbow: Normal.     Left elbow: Normal.     Right hand: Normal.     Left hand: Normal.     Cervical back: Full passive range of motion without pain, normal range of motion and neck supple.     Right lower leg: No edema.     Left lower leg: No edema.     Comments: Right tenderness at [facility] joint radiates to bicep region. Negative cross arm test, positive apprehension test.  Lymphadenopathy:     Cervical: No cervical adenopathy.  Skin:    General: Skin is warm and dry.  Neurological:     Mental Status: She is alert and oriented to person, place, and time.  Deep Tendon Reflexes: Reflexes are normal and symmetric.  Psychiatric:        Mood and Affect: Mood normal.        Behavior: Behavior normal.        Thought Content: Thought content normal.     No results found for any visits on 07/20/22.    Assessment & Plan:    Routine Health Maintenance and Physical Exam  Immunization History  Administered  Date(s) Administered   Fluad Quad(high Dose 65+) 07/31/2019   Influenza, High Dose Seasonal PF 08/09/2021   Influenza-Unspecified 10/19/2020, 08/09/2021   PFIZER(Purple Top)SARS-COV-2 Vaccination 01/12/2020, 02/05/2020, 09/01/2020, 08/09/2021   PNEUMOCOCCAL CONJUGATE-20 07/20/2022   Pfizer Covid-19 Vaccine Bivalent Booster 81yr & up 08/09/2021   Pneumococcal Conjugate-13 09/11/2019   Tdap 12/30/2021   Health Maintenance  Topic Date Due   INFLUENZA VACCINE  06/21/2022   COVID-19 Vaccine (5 - Pfizer risk series) 08/05/2022 (Originally 10/04/2021)   Zoster Vaccines- Shingrix (1 of 2) 10/20/2022 (Originally 11/04/1972)   MAMMOGRAM  11/25/2022   COLONOSCOPY (Pts 45-410yrInsurance coverage will need to be confirmed)  08/06/2025   TETANUS/TDAP  12/31/2031   Pneumonia Vaccine 6563Years old  Completed   DEXA SCAN  Completed   Hepatitis C Screening  Completed   HPV VACCINES  Aged Out   Discussed health benefits of physical activity, and encouraged her to engage in regular exercise appropriate for her age and condition.  Problem List Items Addressed This Visit       Other   Chronic right shoulder pain   Relevant Medications   diclofenac Sodium (VOLTAREN) 1 % GEL   Other Relevant Orders   DG Shoulder Right   Pure hypercholesterolemia   Relevant Orders   Lipid panel   Other Visit Diagnoses     Encounter for preventative adult health care exam with abnormal findings    -  Primary   Relevant Orders   CBC   Comprehensive metabolic panel   Subacute maxillary sinusitis       Relevant Medications   fluticasone (FLONASE) 50 MCG/ACT nasal spray   azelastine (ASTELIN) 0.1 % nasal spray   Need for pneumococcal vaccine       Relevant Orders   Pneumococcal conjugate vaccine 20-valent (Prevnar 20) (Completed)      Return for CPE (fasting).     ChWilfred LacyNP

## 2022-07-20 NOTE — Patient Instructions (Addendum)
Go to lab Start daily strength exercise. Schedule appt with ophthalmology and dentist.  Preventive Care 77 Years and Older, Female Preventive care refers to lifestyle choices and visits with your health care provider that can promote health and wellness. Preventive care visits are also called wellness exams. What can I expect for my preventive care visit? Counseling Your health care provider may ask you questions about your: Medical history, including: Past medical problems. Family medical history. Pregnancy and menstrual history. History of falls. Current health, including: Memory and ability to understand (cognition). Emotional well-being. Home life and relationship well-being. Sexual activity and sexual health. Lifestyle, including: Alcohol, nicotine or tobacco, and drug use. Access to firearms. Diet, exercise, and sleep habits. Work and work Statistician. Sunscreen use. Safety issues such as seatbelt and bike helmet use. Physical exam Your health care provider will check your: Height and weight. These may be used to calculate your BMI (body mass index). BMI is a measurement that tells if you are at a healthy weight. Waist circumference. This measures the distance around your waistline. This measurement also tells if you are at a healthy weight and may help predict your risk of certain diseases, such as type 2 diabetes and high blood pressure. Heart rate and blood pressure. Body temperature. Skin for abnormal spots. What immunizations do I need?  Vaccines are usually given at various ages, according to a schedule. Your health care provider will recommend vaccines for you based on your age, medical history, and lifestyle or other factors, such as travel or where you work. What tests do I need? Screening Your health care provider may recommend screening tests for certain conditions. This may include: Lipid and cholesterol levels. Hepatitis C test. Hepatitis B test. HIV  (human immunodeficiency virus) test. STI (sexually transmitted infection) testing, if you are at risk. Lung cancer screening. Colorectal cancer screening. Diabetes screening. This is done by checking your blood sugar (glucose) after you have not eaten for a while (fasting). Mammogram. Talk with your health care provider about how often you should have regular mammograms. BRCA-related cancer screening. This may be done if you have a family history of breast, ovarian, tubal, or peritoneal cancers. Bone density scan. This is done to screen for osteoporosis. Talk with your health care provider about your test results, treatment options, and if necessary, the need for more tests. Follow these instructions at home: Eating and drinking  Eat a diet that includes fresh fruits and vegetables, whole grains, lean protein, and low-fat dairy products. Limit your intake of foods with high amounts of sugar, saturated fats, and salt. Take vitamin and mineral supplements as recommended by your health care provider. Do not drink alcohol if your health care provider tells you not to drink. If you drink alcohol: Limit how much you have to 0-1 drink a day. Know how much alcohol is in your drink. In the [place] one drink equals one 12 oz bottle of beer (355 mL), one 5 oz glass of wine (148 mL), or one 1 oz glass of hard liquor (44 mL). Lifestyle Brush your teeth every morning and night with fluoride toothpaste. Floss one time each day. Exercise for at least 30 minutes 5 or more days each week. Do not use any products that contain nicotine or tobacco. These products include cigarettes, chewing tobacco, and vaping devices, such as e-cigarettes. If you need help quitting, ask your health care provider. Do not use drugs. If you are sexually active, practice safe sex. Use a condom or other  form of protection in order to prevent STIs. Take aspirin only as told by your health care provider. Make sure that you understand  how much to take and what form to take. Work with your health care provider to find out whether it is safe and beneficial for you to take aspirin daily. Ask your health care provider if you need to take a cholesterol-lowering medicine (statin). Find healthy ways to manage stress, such as: Meditation, yoga, or listening to music. Journaling. Talking to a trusted person. Spending time with friends and family. Minimize exposure to UV radiation to reduce your risk of skin cancer. Safety Always wear your seat belt while driving or riding in a vehicle. Do not drive: If you have been drinking alcohol. Do not ride with someone who has been drinking. When you are tired or distracted. While texting. If you have been using any mind-altering substances or drugs. Wear a helmet and other protective equipment during sports activities. If you have firearms in your house, make sure you follow all gun safety procedures. What's next? Visit your health care provider once a year for an annual wellness visit. Ask your health care provider how often you should have your eyes and teeth checked. Stay up to date on all vaccines. This information is not intended to replace advice given to you by your health care provider. Make sure you discuss any questions you have with your health care provider. Document Revised: 05/05/2021 Document Reviewed: 05/05/2021 Elsevier Patient Education  [place].

## 2022-09-19 DIAGNOSIS — S0181XA Laceration without foreign body of other part of head, initial encounter: Secondary | ICD-10-CM | POA: Diagnosis not present

## 2022-09-25 DIAGNOSIS — R519 Headache, unspecified: Secondary | ICD-10-CM | POA: Diagnosis not present

## 2022-09-25 DIAGNOSIS — S0181XD Laceration without foreign body of other part of head, subsequent encounter: Secondary | ICD-10-CM | POA: Diagnosis not present

## 2022-09-25 DIAGNOSIS — Z4802 Encounter for removal of sutures: Secondary | ICD-10-CM | POA: Diagnosis not present

## 2022-09-25 DIAGNOSIS — M542 Cervicalgia: Secondary | ICD-10-CM | POA: Diagnosis not present

## 2022-09-29 ENCOUNTER — Ambulatory Visit (INDEPENDENT_AMBULATORY_CARE_PROVIDER_SITE_OTHER): Payer: Medicare Other | Admitting: Nurse Practitioner

## 2022-09-29 ENCOUNTER — Encounter: Payer: Self-pay | Admitting: Nurse Practitioner

## 2022-09-29 VITALS — BP 122/78 | HR 90 | Temp 96.7°F | Ht 64.0 in | Wt 146.0 lb

## 2022-09-29 DIAGNOSIS — F4321 Adjustment disorder with depressed mood: Secondary | ICD-10-CM

## 2022-09-29 DIAGNOSIS — Z23 Encounter for immunization: Secondary | ICD-10-CM | POA: Diagnosis not present

## 2022-09-29 DIAGNOSIS — Z634 Disappearance and death of family member: Secondary | ICD-10-CM

## 2022-09-29 DIAGNOSIS — S060X0A Concussion without loss of consciousness, initial encounter: Secondary | ICD-10-CM

## 2022-09-29 DIAGNOSIS — J320 Chronic maxillary sinusitis: Secondary | ICD-10-CM

## 2022-09-29 MED ORDER — FLUTICASONE PROPIONATE 50 MCG/ACT NA SUSP: 2.0000 | Freq: Every day | NASAL | 1 refills | Status: AC

## 2022-09-29 MED ORDER — AZELASTINE HCL 0.1 % NA SOLN: 1.0000 | Freq: Two times a day (BID) | NASAL | 1 refills | Status: AC

## 2022-09-29 NOTE — Progress Notes (Signed)
Established Patient Visit  Patient: Crystal Blair   DOB: 05-18-1951   71 y.o. Female  MRN: [id number] Visit Date: 09/29/2022  Subjective:    Chief Complaint  Patient presents with   Acute Visit    Had a fall on 09/19/22, says today she feels off balanced  Injured right side of face  High stress levels due to death of son    Head Injury  The incident occurred more than 1 week ago. The injury mechanism was a fall. There was no loss of consciousness. The volume of blood lost was minimal. The quality of the pain is described as dull. The pain is mild. The pain has been constant since the injury. Pertinent negatives include no blurred vision, disorientation, headaches, memory loss, numbness, tinnitus, vomiting or weakness. She has tried NSAIDs for the symptoms. The treatment provided significant relief.  Sinusitis This is a recurrent problem. The current episode started more than 1 month ago. The problem has been waxing and waning since onset. There has been no fever. Associated symptoms include congestion and sinus pressure. Pertinent negatives include no chills, coughing, diaphoresis, ear pain, headaches, hoarse voice, neck pain, shortness of breath, sneezing, sore throat or swollen glands. Treatments tried: flonase and azelastine but ran out 1week ago.  Fall occurred while in [place] 09/19/2022. Her son was hospitalized, now deceased. She tripped on a rug at the hotel. Impact of right side of face and head. Laceration on right upper periorbital region has healed. She was evaluated twice at urgent care clinic in [place]. Today she reports mild lightheadedness (chronic, intermittent prior to head injury). 1st occurences today since head injury. Plans to start grief counseling, will schedule.  Reviewed medical, surgical, and social history today  Medications: Outpatient Medications Prior to Visit  Medication Sig   Bioflavonoid Products (VITAMIN C) CHEW Chew 1 tablet by mouth daily in  the afternoon.   hyoscyamine (LEVSIN SL) 0.125 MG SL tablet Place 1 tablet (0.125 mg total) under the tongue every 6 (six) hours as needed.   mesalamine (APRISO) 0.375 g 24 hr capsule Take 4 capsules (1.5 g total) by mouth daily.   Multiple Vitamin (MULTIVITAMIN) tablet Take 1 tablet by mouth daily.   [DISCONTINUED] azelastine (ASTELIN) 0.1 % nasal spray Place 1 spray into both nostrils 2 (two) times daily. Use in each nostril as directed (Patient not taking: Reported on 09/29/2022)   [DISCONTINUED] diclofenac Sodium (VOLTAREN) 1 % GEL Apply 2 g topically 4 (four) times daily. (Patient not taking: Reported on 09/29/2022)   [DISCONTINUED] fluticasone (FLONASE) 50 MCG/ACT nasal spray Place 2 sprays into both nostrils daily. (Patient not taking: Reported on 09/29/2022)   No facility-administered medications prior to visit.   Reviewed past medical and social history.   ROS per HPI above      Objective:  BP 122/78 (BP Location: Right Arm, Patient Position: Sitting, Cuff Size: Small)   Pulse 90   Temp (!) 96.7 F (35.9 C) (Temporal)   Ht '5\' 4"'$  (1.626 m)   Wt 146 lb (66.2 kg)   SpO2 98%   BMI 25.06 kg/m      Physical Exam Vitals reviewed.  HENT:     Right Ear: Tympanic membrane, ear canal and external ear normal.     Left Ear: Tympanic membrane, ear canal and external ear normal.  Eyes:     Extraocular Movements: Extraocular movements intact.     Conjunctiva/sclera:  Conjunctivae normal.     Pupils: Pupils are equal, round, and reactive to light.  Cardiovascular:     Rate and Rhythm: Normal rate and regular rhythm.     Pulses: Normal pulses.     Heart sounds: Normal heart sounds.  Pulmonary:     Effort: Pulmonary effort is normal.     Breath sounds: Normal breath sounds.  Musculoskeletal:     Cervical back: Normal range of motion and neck supple.  Neurological:     Mental Status: She is alert and oriented to person, place, and time.     Cranial Nerves: No cranial nerve deficit.      Motor: No weakness.     Gait: Gait normal.  Psychiatric:        Mood and Affect: Mood normal.        Behavior: Behavior normal.        Thought Content: Thought content normal.     No results found for any visits on 09/29/22.    Assessment & Plan:    Problem List Items Addressed This Visit       Respiratory   Chronic maxillary sinusitis   Relevant Medications   azelastine (ASTELIN) 0.1 % nasal spray   fluticasone (FLONASE) 50 MCG/ACT nasal spray   Other Visit Diagnoses     Concussion without loss of consciousness, initial encounter    -  Primary   Need for immunization against influenza       Relevant Orders   Flu Vaccine QUAD High Dose(Fluad) (Completed)   Grief at loss of child         Resume flonase and azelastine If no improvement in 1week, call office for oral abx prescription. Provided concussion education and ED precaution. Advised about using grief services at [facility] and/or Al-Anon group meetings  Return in about 9 months (around 06/30/2023) for CPE (fasting).     Wilfred Lacy, NP

## 2022-09-29 NOTE — Patient Instructions (Signed)
Resume flonase and azelastine If no improvement in 1week, call office for oral abx prescription.  Concussion, Adult  A concussion is a brain injury from a hard, direct hit (trauma) to your head or body. This hit causes your brain to quickly shake back and forth inside your skull. A concussion may also be called a mild traumatic brain injury (TBI). Healing from this injury can take time. The effects of a concussion can be serious. If you have a concussion, you should be very careful to avoid having a second concussion. What are the causes? This condition is caused by: A direct hit to your head. A quick and sudden movement of the head or neck, such as in a car crash. What are the signs or symptoms? The signs of a concussion can be hard to notice. They may be missed by you, family members, and doctors. You may look fine on the outside but may not act or feel normal. Physical symptoms Headaches or feeling dizzy. Problems with body balance. Being sensitive to light or noise. Vomiting or feeling like you may vomit. Being tired. Problems seeing or hearing. Seizure. Mental and emotional symptoms Feeling grouchy (irritable) or having mood changes. Problems remembering things. Trouble focusing your mind (concentrating), organizing, or making decisions. Not sleeping or eating as you used to. Being slow to think, act, react, speak, or read. Feeling worried or nervous (anxious). Feeling sad (depressed). How is this treated? This condition may be treated by: Stopping sports or activity if you are injured. Resting your body and your mind. Being watched carefully, often at home. Medicines to help with symptoms such as: Headaches. Feeling like you may vomit. Problems with sleep. You may need to go to a concussion clinic or a place to help you recover (rehab). Follow these instructions at home: Activity Limit activities that need a lot of thought or focus, such as: Homework or work for your  job. Watching TV. Using the computer or phone. Playing memory games and puzzles. Get rest because this helps your brain heal. Make sure you: Get plenty of sleep. Most adults should get 7-9 hours of sleep each night. Rest during the day. Take naps or breaks when you feel tired. Avoid activity or exercise that takes a lot of effort until your doctor says it is safe. Stop any activity that makes symptoms worse. Your doctor may tell you to do light exercise like walking. Do not do activities that could cause a second concussion, such as riding a bike or playing sports. Ask your doctor when you can return to your normal activities, such as school, work, sports, and driving. Your ability to react may be slower. Do not do these activities if you are dizzy. General instructions  Take over-the-counter and prescription medicines only as told by your doctor. Avoid taking strong pain medicines (opioids) after a concussion. Do not drink alcohol until your doctor says you can. Watch your symptoms and tell other people to do the same. Other problems can occur after a concussion. Tell your work Freight forwarder, teachers, Government social research officer, school counselor, coach, or Product/process development scientist about your injury and symptoms. Tell them about what you can or cannot do. See a mental health therapist if you keep feeling worried and nervous or sad. Keep all follow-up visits. Your doctor will check on your recovery and give you a plan for returning to activities. How is this prevented? It is very important that you do not get another brain injury. In rare cases, another injury can  cause brain damage that will not go away, brain swelling, or death. The risk of this is greatest in the first 7-10 days after a head injury. To avoid injuries: Stop activities that could lead to a second concussion, such as contact sports, until your doctor says it is okay. When you return to sports or activities: Do not crash into other players. This is  how most concussions happen. Follow the rules. Respect other players. Do not engage in violent behavior while playing. Get regular exercise. Do strength and balance training. Wear a helmet that fits you well during sports, biking, or other activities. Helmets can help protect you from serious skull and brain injuries, but they may not protect you from a concussion. Even when wearing a helmet, you should avoid being hit in the head. Where to find more information Centers for Disease Control and Prevention: [employer].cy Contact a doctor if: Your symptoms do not get better or get worse. You have new symptoms. You have another injury. Your balance gets worse. You have changes in how you act. Get help right away if: You have very bad headaches or your headaches get worse. You have any of these problems: Feeling weak or numb in any part of your body. Slurred speech. Changes in how you see (vision). Feeling mixed up (confused). You vomit often. You faint or other people have trouble waking you up. You have a seizure. These symptoms may be an emergency. Get help right away. Call 911. Do not wait to see if the symptoms will go away. Do not drive yourself to the hospital. Also, get help right away if: You have thoughts of hurting yourself or others. Take one of these steps if you feel like you may hurt yourself or others, or have thoughts about taking your own life: Go to your nearest emergency room. Call 911. Call the [facility] at [phone]) [phone] or 988. This is open 24 hours a day. Text the Crisis Text Line at 256-765-7585. This information is not intended to replace advice given to you by your health care provider. Make sure you discuss any questions you have with your health care provider. Document Revised: 04/01/2022 Document Reviewed: 04/01/2022 Elsevier Patient Education  [place].

## 2022-10-06 ENCOUNTER — Encounter: Payer: Self-pay | Admitting: Nurse Practitioner

## 2022-11-01 ENCOUNTER — Other Ambulatory Visit: Payer: Self-pay | Admitting: Nurse Practitioner

## 2022-11-01 DIAGNOSIS — Z1231 Encounter for screening mammogram for malignant neoplasm of breast: Secondary | ICD-10-CM

## 2022-12-05 ENCOUNTER — Telehealth: Payer: Self-pay | Admitting: Gastroenterology

## 2022-12-05 NOTE — Telephone Encounter (Signed)
This patient's 1/25 appointment had to be rescheduled because the doctor will not be available that day.  She was rescheduled to 2/19 at 9:40 a.m.  However, she does not feel like she can wait that long and asked if there was any way she could be seen by Dr. Bryan Lemma (she does not want to see an APP) before the 25th.  Please call patient and advise.  Thank you.

## 2022-12-05 NOTE — Telephone Encounter (Signed)
Spoke with pt. Pt states she did not want to see a NP or PA because she felt like she may need a hemorrhoid banding. Let pt know we would contact her if anything came open on Dr. Vivia Ewing schedule. Pt verbalized understanding and had no other concerns at end of call.

## 2022-12-15 ENCOUNTER — Ambulatory Visit: Payer: Medicare Other | Admitting: Gastroenterology

## 2022-12-20 ENCOUNTER — Ambulatory Visit: Payer: Medicare Other | Admitting: Physician Assistant

## 2022-12-20 ENCOUNTER — Other Ambulatory Visit (INDEPENDENT_AMBULATORY_CARE_PROVIDER_SITE_OTHER): Payer: Medicare Other

## 2022-12-20 ENCOUNTER — Encounter: Payer: Self-pay | Admitting: Physician Assistant

## 2022-12-20 ENCOUNTER — Encounter: Payer: Self-pay | Admitting: Gastroenterology

## 2022-12-20 VITALS — BP 140/80 | HR 91 | Ht 63.0 in | Wt 142.0 lb

## 2022-12-20 DIAGNOSIS — K649 Unspecified hemorrhoids: Secondary | ICD-10-CM

## 2022-12-20 DIAGNOSIS — R1032 Left lower quadrant pain: Secondary | ICD-10-CM

## 2022-12-20 DIAGNOSIS — K50119 Crohn's disease of large intestine with unspecified complications: Secondary | ICD-10-CM | POA: Diagnosis not present

## 2022-12-20 DIAGNOSIS — R197 Diarrhea, unspecified: Secondary | ICD-10-CM

## 2022-12-20 DIAGNOSIS — Z86010 Personal history of colonic polyps: Secondary | ICD-10-CM | POA: Diagnosis not present

## 2022-12-20 LAB — COMPREHENSIVE METABOLIC PANEL
ALT: 14 U/L (ref 0–35)
AST: 14 U/L (ref 0–37)
Albumin: 3.9 g/dL (ref 3.5–5.2)
Alkaline Phosphatase: 70 U/L (ref 39–117)
BUN: 10 mg/dL (ref 6–23)
CO2: 27 mEq/L (ref 19–32)
Calcium: 9.6 mg/dL (ref 8.4–10.5)
Chloride: 100 mEq/L (ref 96–112)
Creatinine, Ser: 0.53 mg/dL (ref 0.40–1.20)
GFR: 94.56 mL/min (ref >60.00)
Glucose, Bld: 100 mg/dL — ABNORMAL HIGH (ref 70–99)
Potassium: 3.9 mEq/L (ref 3.5–5.1)
Sodium: 136 mEq/L (ref 135–145)
Total Bilirubin: 1.2 mg/dL (ref 0.2–1.2)
Total Protein: 7.6 g/dL (ref 6.0–8.3)

## 2022-12-20 LAB — CBC WITH DIFFERENTIAL/PLATELET
Basophils Absolute: 0 10*3/uL (ref 0.0–0.1)
Basophils Relative: 0.4 % (ref 0.0–3.0)
Eosinophils Absolute: 0.1 10*3/uL (ref 0.0–0.7)
Eosinophils Relative: 1.4 % (ref 0.0–5.0)
HCT: 39.4 % (ref 36.0–46.0)
Hemoglobin: 13.3 g/dL (ref 12.0–15.0)
Lymphocytes Relative: 21.9 % (ref 12.0–46.0)
Lymphs Abs: 1.4 10*3/uL (ref 0.7–4.0)
MCHC: 33.8 g/dL (ref 30.0–36.0)
MCV: 88.1 fl (ref 78.0–100.0)
Monocytes Absolute: 0.4 10*3/uL (ref 0.1–1.0)
Monocytes Relative: 7.1 % (ref 3.0–12.0)
Neutro Abs: 4.4 10*3/uL (ref 1.4–7.7)
Neutrophils Relative %: 69.2 % (ref 43.0–77.0)
Platelets: 360 10*3/uL (ref 150.0–400.0)
RBC: 4.47 Mil/uL (ref 3.87–5.11)
RDW: 13.7 % (ref 11.5–15.5)
WBC: 6.4 10*3/uL (ref 4.0–10.5)

## 2022-12-20 LAB — SEDIMENTATION RATE: Sed Rate: 74 mm/hr — ABNORMAL HIGH (ref 0–30)

## 2022-12-20 LAB — HIGH SENSITIVITY CRP: CRP, High Sensitivity: 105.32 mg/L — ABNORMAL HIGH (ref 0.000–5.000)

## 2022-12-20 LAB — TSH: TSH: 0.97 u[IU]/mL (ref 0.35–5.50)

## 2022-12-20 MED ORDER — METRONIDAZOLE 500 MG PO TABS: 500.0000 mg | ORAL_TABLET | Freq: Three times a day (TID) | ORAL | 0 refills | Status: DC

## 2022-12-20 MED ORDER — DICYCLOMINE HCL 20 MG PO TABS: 20.0000 mg | ORAL_TABLET | Freq: Three times a day (TID) | ORAL | 0 refills | Status: DC | PRN

## 2022-12-20 MED ORDER — CIPROFLOXACIN HCL 500 MG PO TABS: 500.0000 mg | ORAL_TABLET | Freq: Two times a day (BID) | ORAL | 0 refills | Status: DC

## 2022-12-20 NOTE — Patient Instructions (Addendum)
You will be contacted by [facility] in the next 2 days to arrange a CT Abdomen/Pelvis.  The number on your caller ID will be [phone], please answer when they call.  If you have not heard from them in 2 days please call [phone] to schedule.    Your provider has requested that you go to the basement level for lab work before leaving today. Press "B" on the elevator. The lab is located at the first door on the left as you exit the elevator.   Due to recent changes in healthcare laws, you may see the results of your imaging and laboratory studies on MyChart before your provider has had a chance to review them.  We understand that in some cases there may be results that are confusing or concerning to you. Not all laboratory results come back in the same time frame and the provider may be waiting for multiple results in order to interpret others.  Please give [place] 48 hours in order for your provider to thoroughly review all the results before contacting the office for clarification of your results.    Keep your scheduled appointment with Dr Cathleen Corti     _______________________________________________________  If your blood pressure at your visit was 140/90 or greater, please contact your primary care physician to follow up on this.  _______________________________________________________  If you are age 51 or older, your body mass index should be between 23-30. Your Body mass index is 25.15 kg/m. If this is out of the aforementioned range listed, please consider follow up with your Primary Care Provider.  If you are age 68 or younger, your body mass index should be between 19-25. Your Body mass index is 25.15 kg/m. If this is out of the aformentioned range listed, please consider follow up with your Primary Care Provider.   ________________________________________________________  The [place] providers would like to encourage you to use [facility] to communicate with  providers for non-urgent  requests or questions.  Due to long hold times on the telephone, sending your provider a message by [facility] may be a faster and more efficient way to get a response.  Please allow 48 business hours for a response.  Please remember that this is for non-urgent requests.  _______________________________________________________   I appreciate the  opportunity to care for you  Thank You   [facility]

## 2022-12-20 NOTE — Progress Notes (Signed)
12/20/2022 Crystal Blair [id number] [birth year]  Referring provider: Flossie Buffy, NP Primary GI doctor: Dr. Bryan Lemma  ASSESSMENT AND PLAN:   History of SCAD with worsening AB pain and diarrhea/constipation with mucus, fever and chills Will evaluate for infection with GI pathogen panel Will check CRP/ESR Check celiac, TSH With fever, chills and pain on exam will evaluate with CT AB and pelvis with contrast, will treat with cipro/flagyl for diverticulitis/SCAD Continue apriso same dose Will switch levsin to dicyclomine, stop ETOH Has close follow up with Dr. Bryan Lemma, keep this appointment  History of colonic polyps RECALL 07/2025, consider sooner if symptoms do no resolve  Grief reaction Can we some IBS symptoms with son's death and holiday's Encouraged patient to find therapist and she is trying to do that at this time.   Patient Care Team: Nche, Charlene Brooke, NP as PCP - General (Internal Medicine) Lavonna Monarch, MD (Inactive) as Consulting Physician (Dermatology)  HISTORY OF PRESENT ILLNESS: 72 y.o. female with a past medical history of SCAD, colon polyps and others listed below presents for evaluation of AB pain.  03/23/2022 office visit Dr. Bryan Lemma for segmental colitis with complication, had hemorrhoidal banding 01/26/2021, previously well-controlled scad on Apriso, refilled that visit. Had some pain at that time, given levsin. This worked short time about 2-3 hours and did not help.   She states she has had increasing stress, son with drug problem and history of crohn's, passed Oct 27th.  His Bday was Jan 22, and she had a hard time with the holidays.  States she was appropriately sad but then right around her birthday[birth year] was increasing sugar/sweets, and started to have severe AB symptoms.  She states she thought she had to have urinate before bed, waited until morning, urinated with pelvic pressure, no stool symptoms at that time.  She having  pain "all over" even in back, neck, Ab pain.  Has not had diarrhea until this week, would have thin ribbon stool.  No blood in stool but has had mucus. Has had some fever/chills.  Can have feel need to have BM but will not have any stool.  Can have nausea with pain and vomiting.  She has had decreased appetite, Friday she over ate, crispy orange beef with a lot of wine, and she woke up very sick Saturday. Has increase ETOH with death of her son.  She is still on the mesalamine.   Endoscopic history: -Multiple colonoscopies in [place] prior to moving to [place].  Started colonoscopies approx age 4, notable for polyps at that time, and has subsequently had frequent surveillance colonoscopy, with a couple of polyps since then.  Also hx of internal hemorrhoids and diverticulosis. -Last colonoscopy in [place] approximate 2016 and notable for tubular adenoma with recommendation repeat in 5 years. -Colonoscopy (08/06/2020, Dr. Bryan Lemma): 2 sessile serrated polyps in transverse and ascending colon, nonbleeding cecal AVM, localized moderate gastritis in sigmoid colon 20-30 cm from anal verge (path: Diverticular Associated Colitis), sigmoid and ascending diverticulosis, grade 2 hemorrhoids.  Normal TI.  Repeat 5 years RECALL 07/2025  She  reports that she has never smoked. She has never used smokeless tobacco. She reports current alcohol use. She reports that she does not use drugs.  Current Medications:     Current Outpatient Medications (Respiratory):    azelastine (ASTELIN) 0.1 % nasal spray, Place 1 spray into both nostrils 2 (two) times daily. Use in each nostril as directed   fluticasone (FLONASE) 50 MCG/ACT nasal spray, Place  2 sprays into both nostrils daily.    Current Outpatient Medications (Other):    Bioflavonoid Products (VITAMIN C) CHEW, Chew 1 tablet by mouth daily in the afternoon.   ciprofloxacin (CIPRO) 500 MG tablet, Take 1 tablet (500 mg total) by mouth 2 (two) times daily.   dicyclomine  (BENTYL) 20 MG tablet, Take 1 tablet (20 mg total) by mouth 3 (three) times daily as needed for spasms.   mesalamine (APRISO) 0.375 g 24 hr capsule, Take 4 capsules (1.5 g total) by mouth daily.   metroNIDAZOLE (FLAGYL) 500 MG tablet, Take 1 tablet (500 mg total) by mouth 3 (three) times daily.   Multiple Vitamin (MULTIVITAMIN) tablet, Take 1 tablet by mouth daily.  Medical History:  Past Medical History:  Diagnosis Date   Arthritis    Diverticulosis    History of chicken pox    History of gallstones    Tubular adenoma 2015   Per Colonoscopy/q48yr   Ulcerative colitis ([place]    Allergies:  Allergies  Allergen Reactions   Penicillin G Other (See Comments)    As a kid had a reaction    Mango Flavor      Surgical History:  She  has a past surgical history that includes Cholecystectomy (1991); Breast biopsy (Left, 2015); Breast excisional biopsy (Left); Retinal tear repair cryotherapy (Right, 05/18/2020); Colonoscopy; Esophagogastroduodenoscopy; and Replacement total knee (03/02/2021). Family History:  Her family history includes Alcohol abuse in her father; Arthritis in her mother; Bladder Cancer in her sister; COPD in her mother; Colon cancer in her maternal aunt; Crohn's disease in her son; Depression in her mother; Early death (age of onset: 42 in her father; Hyperlipidemia in her father; Hypertension in her father and sister.  REVIEW OF SYSTEMS  : All other systems reviewed and negative except where noted in the History of Present Illness.  PHYSICAL EXAM: BP (!) 140/80 Comment: 120/80  Pulse 91   Ht '5\' 3"'$  (1.6 m)   Wt 142 lb (64.4 kg)   BMI 25.15 kg/m  General:   Pleasant, well developed female in no acute distress Head:   Normocephalic and atraumatic. Eyes:  sclerae anicteric,conjunctive pink  Heart:   regular rate and rhythm Pulm:  Clear anteriorly; no wheezing Abdomen:   Soft, Non-distended AB, Active bowel sounds. moderate tenderness in the LLQ. Without guarding and  Without rebound, No organomegaly appreciated. Rectal: Not evaluated Extremities:  Without edema. Msk: Symmetrical without gross deformities. Peripheral pulses intact.  Neurologic:  Alert and  oriented x4;  No focal deficits.  Skin:   Dry and intact without significant lesions or rashes. Psychiatric:  Cooperative. Normal mood and affect.  RELEVANT LABS AND IMAGING: CBC    Component Value Date/Time   WBC 4.9 07/20/2022 0927   RBC 4.45 07/20/2022 0927   HGB 13.7 07/20/2022 0927   HCT 41.4 07/20/2022 0927   PLT 284.0 07/20/2022 0927   MCV 93.0 07/20/2022 0927   MCHC 33.1 07/20/2022 0927   RDW 13.8 07/20/2022 0927   LYMPHSABS 2.1 09/11/2019 0936   MONOABS 0.4 09/11/2019 0936   EOSABS 0.2 09/11/2019 0936   BASOSABS 0.1 09/11/2019 0936    CMP     Component Value Date/Time   NA 142 07/20/2022 0927   NA 143 03/12/2015 0000   K 5.3 No hemolysis seen (H) 07/20/2022 0927   CL 106 07/20/2022 0927   CO2 29 07/20/2022 0927   GLUCOSE 89 07/20/2022 0927   BUN 14 07/20/2022 0927   BUN 13 03/12/2015 0000  CREATININE 0.61 07/20/2022 0927   CALCIUM 9.8 07/20/2022 0927   PROT 7.4 07/20/2022 0927   ALBUMIN 4.1 07/20/2022 0927   AST 20 07/20/2022 0927   ALT 19 07/20/2022 0927   ALKPHOS 53 07/20/2022 0927   BILITOT 1.0 07/20/2022 0927     Vladimir Crofts, PA-C 9:32 AM

## 2022-12-21 LAB — TISSUE TRANSGLUTAMINASE, IGA: (tTG) Ab, IgA: <1 U/mL

## 2022-12-21 LAB — IGA: Immunoglobulin A: 229 mg/dL (ref 70–320)

## 2022-12-23 ENCOUNTER — Other Ambulatory Visit: Payer: Medicare Other

## 2022-12-23 DIAGNOSIS — R1032 Left lower quadrant pain: Secondary | ICD-10-CM | POA: Diagnosis not present

## 2022-12-23 DIAGNOSIS — A09 Infectious gastroenteritis and colitis, unspecified: Secondary | ICD-10-CM | POA: Diagnosis not present

## 2022-12-23 DIAGNOSIS — R197 Diarrhea, unspecified: Secondary | ICD-10-CM

## 2022-12-23 DIAGNOSIS — A048 Other specified bacterial intestinal infections: Secondary | ICD-10-CM | POA: Diagnosis not present

## 2022-12-26 ENCOUNTER — Ambulatory Visit
Admission: RE | Admit: 2022-12-26 | Discharge: 2022-12-26 | Disposition: A | Discharge status: Home / Self Care | Payer: Medicare Other | Source: Ambulatory Visit | Attending: Nurse Practitioner | Admitting: Nurse Practitioner

## 2022-12-26 DIAGNOSIS — Z1231 Encounter for screening mammogram for malignant neoplasm of breast: Secondary | ICD-10-CM | POA: Diagnosis not present

## 2022-12-27 DIAGNOSIS — D225 Melanocytic nevi of trunk: Secondary | ICD-10-CM | POA: Diagnosis not present

## 2022-12-27 DIAGNOSIS — L814 Other melanin hyperpigmentation: Secondary | ICD-10-CM | POA: Diagnosis not present

## 2022-12-27 DIAGNOSIS — L821 Other seborrheic keratosis: Secondary | ICD-10-CM | POA: Diagnosis not present

## 2022-12-27 DIAGNOSIS — L570 Actinic keratosis: Secondary | ICD-10-CM | POA: Diagnosis not present

## 2022-12-27 LAB — GI PROFILE, STOOL, PCR

## 2023-01-02 ENCOUNTER — Ambulatory Visit (HOSPITAL_COMMUNITY)
Admission: RE | Admit: 2023-01-02 | Discharge: 2023-01-02 | Disposition: A | Discharge status: Home / Self Care | Payer: Medicare Other | Source: Ambulatory Visit | Attending: Physician Assistant | Admitting: Physician Assistant

## 2023-01-02 DIAGNOSIS — R1032 Left lower quadrant pain: Secondary | ICD-10-CM | POA: Diagnosis not present

## 2023-01-02 DIAGNOSIS — I700 Atherosclerosis of aorta: Secondary | ICD-10-CM | POA: Diagnosis not present

## 2023-01-02 MED ORDER — IOHEXOL 300 MG/ML  SOLN
80.0000 mL | Freq: Once | INTRAMUSCULAR | Status: AC | PRN
  Administered 2023-01-02: 80 mL via INTRAVENOUS

## 2023-01-05 ENCOUNTER — Other Ambulatory Visit: Payer: Self-pay

## 2023-01-05 ENCOUNTER — Telehealth: Payer: Self-pay

## 2023-01-05 MED ORDER — NYSTATIN 100000 UNIT/ML MT SUSP: 5.0000 mL | Freq: Four times a day (QID) | OROMUCOSAL | 1 refills | Status: DC

## 2023-01-05 MED ORDER — FLUCONAZOLE 150 MG PO TABS: 150.0000 mg | ORAL_TABLET | Freq: Every day | ORAL | 0 refills | Status: DC

## 2023-01-05 MED ORDER — METRONIDAZOLE 500 MG PO TABS: 500.0000 mg | ORAL_TABLET | Freq: Three times a day (TID) | ORAL | 0 refills | Status: DC

## 2023-01-05 MED ORDER — SULFAMETHOXAZOLE-TRIMETHOPRIM 800-160 MG PO TABS: 1.0000 | ORAL_TABLET | Freq: Two times a day (BID) | ORAL | 0 refills | Status: AC

## 2023-01-05 NOTE — Addendum Note (Signed)
Addended by: Vicie Mutters on: 01/05/2023 11:09 AM   Modules accepted: Orders

## 2023-01-05 NOTE — Addendum Note (Signed)
Addended by: Vicie Mutters on: 01/05/2023 08:12 AM   Modules accepted: Orders

## 2023-01-05 NOTE — Telephone Encounter (Signed)
See result note.  

## 2023-01-05 NOTE — Telephone Encounter (Signed)
Received call report from [facility]:  IMPRESSION: 1. Findings most compatible with acute uncomplicated diverticulitis involving the distal descending colon and proximal sigmoid colon. Focal colitis not excluded. No evidence for perforation or abscess.

## 2023-01-09 ENCOUNTER — Ambulatory Visit: Payer: Medicare Other | Admitting: Gastroenterology

## 2023-02-09 ENCOUNTER — Ambulatory Visit: Payer: Medicare Other | Admitting: Gastroenterology

## 2023-02-09 ENCOUNTER — Encounter: Payer: Self-pay | Admitting: Gastroenterology

## 2023-02-09 VITALS — BP 122/82 | HR 84 | Ht 63.5 in | Wt 146.0 lb

## 2023-02-09 DIAGNOSIS — K50119 Crohn's disease of large intestine with unspecified complications: Secondary | ICD-10-CM

## 2023-02-09 DIAGNOSIS — Z86010 Personal history of colonic polyps: Secondary | ICD-10-CM

## 2023-02-09 DIAGNOSIS — K5732 Diverticulitis of large intestine without perforation or abscess without bleeding: Secondary | ICD-10-CM | POA: Diagnosis not present

## 2023-02-09 NOTE — Progress Notes (Signed)
Chief Complaint:    Diverticulitis follow-up, BRBPR  GI History: 72 year old female with history of colon polyps, diverticulosis, hemorrhoids.    Diagnosed with Segmental Colitis Associated with Diverticulosis (SCAD) on colonoscopy 07/2020. Started on Apriso with good clinical response-decreased stool frequency, improved consistency.   Separately, longstanding history of symptomatic hemorrhoids, described as episodic BRBPR, not completely resolved with adequate treatment of SCAD as above. - 01/26/2021: Banding of RA hemorrhoid   Family history notable for maternal aunt with CRC > age 20 and son with Crohn's Disease     Endoscopic history: -Multiple colonoscopies in [place] prior to moving to [place].  Started colonoscopies approx age 73, notable for polyps at that time, and has subsequently had frequent surveillance colonoscopy, with a couple of polyps since then.  Also hx of internal hemorrhoids and diverticulosis. -Last colonoscopy in [place] approximate 2016 and notable for tubular adenoma with recommendation repeat in 5 years. -Colonoscopy (08/06/2020, Dr. Bryan Lemma): 2 sessile serrated polyps in transverse and ascending colon, nonbleeding cecal AVM, localized moderate gastritis in sigmoid colon 20-30 cm from anal verge (path: Diverticular Associated Colitis), sigmoid and ascending diverticulosis, grade 2 hemorrhoids.  Normal TI.  Repeat 5 years  HPI:     Patient is a 72 y.o. female presenting to the Gastroenterology Clinic for follow-up.  Was last seen in the GI clinic by Vicie Mutters on 12/20/2022 and ultimately diagnosed with acute diverticulitis superimposed on known history of SCAD on CT as below. - Normal CBC, CMP, TSH - CRP 105, ESR 74 - Negative/normal celiac serologies - Negative/normal GI PCR - 01/02/2023: CT A/P: Pandiverticulosis with circumferential wall thickening and mild surrounding inflammation in distal descending colon/proximal sigmoid colon consistent with acute, uncomplicated  diverticulitis.  No abscess or perforation.  Was started on Bactrim/Flagyl (PCN allergy)  Has finished Bactrim and Flagyl with overall improvement (finished 2.5 weeks ago).  Now, occasional lower abdominal, dull cramping starting this week.  Not limiting her from activities, and in fact when she is distracted/busy, can go all day long without any symptoms.  Has had some blood-tinged mucus in her stool earlier this week. Otherwise, normal formed stool.    Review of systems:     No chest pain, no SOB, no fevers, no urinary sx   Past Medical History:  Diagnosis Date   Arthritis    Diverticulosis    History of chicken pox    History of gallstones    Tubular adenoma 2015   Per Colonoscopy/q35yrs   Ulcerative colitis ([place])     Patient's surgical history, family medical history, social history, medications and allergies were all reviewed in Epic    Current Outpatient Medications  Medication Sig Dispense Refill   azelastine (ASTELIN) 0.1 % nasal spray Place 1 spray into both nostrils 2 (two) times daily. Use in each nostril as directed 30 mL 1   Bioflavonoid Products (VITAMIN C) CHEW Chew 1 tablet by mouth daily in the afternoon.     dicyclomine (BENTYL) 20 MG tablet Take 1 tablet (20 mg total) by mouth 3 (three) times daily as needed for spasms. 50 tablet 0   fluticasone (FLONASE) 50 MCG/ACT nasal spray Place 2 sprays into both nostrils daily. (Patient taking differently: Place 2 sprays into both nostrils daily as needed.) 16 g 1   mesalamine (APRISO) 0.375 g 24 hr capsule Take 4 capsules (1.5 g total) by mouth daily. 360 capsule 5   Multiple Vitamin (MULTIVITAMIN) tablet Take 1 tablet by mouth daily.     nystatin (MYCOSTATIN)  100000 UNIT/ML suspension Take 5 mLs (500,000 Units total) by mouth 4 (four) times daily. (Patient not taking: Reported on 02/09/2023) 270 mL 1   No current facility-administered medications for this visit.    Physical Exam:     BP 122/82   Pulse 84   Ht 5' 3.5"  (1.613 m)   Wt 146 lb (66.2 kg)   SpO2 99%   BMI 25.46 kg/m   GENERAL:  Pleasant female in NAD PSYCH: : Cooperative, normal affect NEURO: Alert and oriented x 3, no focal neurologic deficits   IMPRESSION and PLAN:    1) Diverticulosis 2) History of diverticulitis 3) Diverticular associated colitis Diverticulosis c/b history of SCAD and more recently acute, uncomplicated diverticulitis in 12/2022.  Good response to Bactrim/Flagyl. - Start probiotic and continue at least 4 weeks - Continue Apriso 4 segmental colitis  4) History of colon polyps Last colonoscopy in 07/2020 notable for 2 subcentimeter SSP's  -Repeat colonoscopy 2026 for ongoing polyp surveillance  5) Grief Sadly, her son died in 25-Aug-2022 (history of Crohn's and substance abuse).  We discussed his death and her subsequent GI symptoms at length today.  Clearly still processing with extended grief.   RTC in 1 year or sooner as needed          Lavena Bullion ,DO, FACG 02/09/2023, 11:09 AM

## 2023-02-09 NOTE — Patient Instructions (Addendum)
Please follow up as needed  Please start a probiotic.  _______________________________________________________  If your blood pressure at your visit was 140/90 or greater, please contact your primary care physician to follow up on this.  _______________________________________________________  If you are age 70 or older, your body mass index should be between 23-30. Your Body mass index is 25.46 kg/m. If this is out of the aforementioned range listed, please consider follow up with your Primary Care Provider.  If you are age 36 or younger, your body mass index should be between 19-25. Your Body mass index is 25.46 kg/m. If this is out of the aformentioned range listed, please consider follow up with your Primary Care Provider.   ________________________________________________________  The [place] [facility] providers would like to encourage you to use [facility] to communicate with providers for non-urgent requests or questions.  Due to long hold times on the telephone, sending your provider a message by [facility] may be a faster and more efficient way to get a response.  Please allow 48 business hours for a response.  Please remember that this is for non-urgent requests.  _______________________________________________________ It was a pleasure to see you today!  Thank you for trusting me with your gastrointestinal care!

## 2023-04-11 ENCOUNTER — Telehealth: Payer: Self-pay | Admitting: Gastroenterology

## 2023-04-11 DIAGNOSIS — K649 Unspecified hemorrhoids: Secondary | ICD-10-CM

## 2023-04-11 DIAGNOSIS — R109 Unspecified abdominal pain: Secondary | ICD-10-CM

## 2023-04-11 DIAGNOSIS — K50119 Crohn's disease of large intestine with unspecified complications: Secondary | ICD-10-CM

## 2023-04-11 MED ORDER — MESALAMINE ER 0.375 G PO CP24
1.5000 g | ORAL_CAPSULE | Freq: Every day | ORAL | 3 refills | Status: DC
Start: 2023-04-11 — End: 2024-03-27

## 2023-04-11 NOTE — Telephone Encounter (Signed)
Rx. Sent to patient's pharmacy. Patient is notified.

## 2023-04-11 NOTE — Telephone Encounter (Signed)
Patient calling states the pharmacy is needing Dr Barron Alvine to refill her Mesalamine. Please advise

## 2023-06-06 ENCOUNTER — Encounter: Payer: Self-pay | Admitting: Nurse Practitioner

## 2023-07-05 DIAGNOSIS — H31091 Other chorioretinal scars, right eye: Secondary | ICD-10-CM | POA: Diagnosis not present

## 2023-07-05 DIAGNOSIS — H5203 Hypermetropia, bilateral: Secondary | ICD-10-CM | POA: Diagnosis not present

## 2023-07-05 DIAGNOSIS — H52223 Regular astigmatism, bilateral: Secondary | ICD-10-CM | POA: Diagnosis not present

## 2023-07-05 DIAGNOSIS — H524 Presbyopia: Secondary | ICD-10-CM | POA: Diagnosis not present

## 2023-07-05 DIAGNOSIS — H43813 Vitreous degeneration, bilateral: Secondary | ICD-10-CM | POA: Diagnosis not present

## 2023-07-05 DIAGNOSIS — H2513 Age-related nuclear cataract, bilateral: Secondary | ICD-10-CM | POA: Diagnosis not present

## 2023-07-06 ENCOUNTER — Encounter (INDEPENDENT_AMBULATORY_CARE_PROVIDER_SITE_OTHER): Payer: Self-pay

## 2023-07-18 ENCOUNTER — Encounter: Payer: Self-pay | Admitting: Nurse Practitioner

## 2023-07-18 DIAGNOSIS — I700 Atherosclerosis of aorta: Secondary | ICD-10-CM | POA: Insufficient documentation

## 2023-07-26 ENCOUNTER — Telehealth: Payer: Self-pay | Admitting: Nurse Practitioner

## 2023-07-26 ENCOUNTER — Ambulatory Visit (INDEPENDENT_AMBULATORY_CARE_PROVIDER_SITE_OTHER): Payer: Medicare Other | Admitting: Nurse Practitioner

## 2023-07-26 ENCOUNTER — Encounter: Payer: Self-pay | Admitting: Nurse Practitioner

## 2023-07-26 VITALS — BP 120/82 | HR 80 | Temp 97.5°F | Resp 16 | Ht 63.5 in | Wt 149.6 lb

## 2023-07-26 DIAGNOSIS — E7800 Pure hypercholesterolemia, unspecified: Secondary | ICD-10-CM | POA: Diagnosis not present

## 2023-07-26 DIAGNOSIS — R7982 Elevated C-reactive protein (CRP): Secondary | ICD-10-CM | POA: Diagnosis not present

## 2023-07-26 DIAGNOSIS — I700 Atherosclerosis of aorta: Secondary | ICD-10-CM

## 2023-07-26 DIAGNOSIS — H9313 Tinnitus, bilateral: Secondary | ICD-10-CM

## 2023-07-26 DIAGNOSIS — M722 Plantar fascial fibromatosis: Secondary | ICD-10-CM | POA: Diagnosis not present

## 2023-07-26 DIAGNOSIS — Z0001 Encounter for general adult medical examination with abnormal findings: Secondary | ICD-10-CM

## 2023-07-26 DIAGNOSIS — Z23 Encounter for immunization: Secondary | ICD-10-CM | POA: Diagnosis not present

## 2023-07-26 LAB — LIPID PANEL
Cholesterol: 179 mg/dL (ref 0–200)
HDL: 58.1 mg/dL (ref >39.00)
LDL Cholesterol: 95 mg/dL (ref 0–99)
NonHDL: 121.27
Total CHOL/HDL Ratio: 3
Triglycerides: 133 mg/dL (ref 0.0–149.0)
VLDL: 26.6 mg/dL (ref 0.0–40.0)

## 2023-07-26 LAB — BASIC METABOLIC PANEL
BUN: 7 mg/dL (ref 6–23)
CO2: 29 meq/L (ref 19–32)
Calcium: 9.5 mg/dL (ref 8.4–10.5)
Chloride: 106 meq/L (ref 96–112)
Creatinine, Ser: 0.63 mg/dL (ref 0.40–1.20)
GFR: 90.32 mL/min (ref >60.00)
Glucose, Bld: 91 mg/dL (ref 70–99)
Potassium: 4.7 meq/L (ref 3.5–5.1)
Sodium: 142 meq/L (ref 135–145)

## 2023-07-26 LAB — HIGH SENSITIVITY CRP: CRP, High Sensitivity: 2.58 mg/L (ref 0.000–5.000)

## 2023-07-26 LAB — SEDIMENTATION RATE: Sed Rate: 11 mm/h (ref 0–30)

## 2023-07-26 MED ORDER — ROSUVASTATIN CALCIUM 20 MG PO TABS
20.0000 mg | ORAL_TABLET | Freq: Every evening | ORAL | 3 refills | Status: AC
Start: 2023-07-26 — End: ?

## 2023-07-26 NOTE — Assessment & Plan Note (Signed)
Acute on chronic, alternates from right to left foot depending on type of shoe and duration of weight bearing activity.  Entered referral to podiatry per her request. Advised to use shoes with adequate support and use of cold compress daily in AM

## 2023-07-26 NOTE — Telephone Encounter (Signed)
Caller Name: Makiyha Ogle Call back phone #: [phone]  Reason for Call: Pt was seen today and prescribed Crestor. She has seen her lab results, thinks they look good.She wonders if she should take this med or not.

## 2023-07-26 NOTE — Addendum Note (Signed)
Addended by: Alysia Penna L on: 07/26/2023 03:40 PM   Modules accepted: Orders

## 2023-07-26 NOTE — Patient Instructions (Signed)
Go to lab Continue Heart healthy diet and daily exercise. Maintain calcium 800mg  and vit D3 1000IU daily for bone health.  DASH Eating Plan DASH stands for Dietary Approaches to Stop Hypertension. The DASH eating plan is a healthy eating plan that has been shown to: Lower high blood pressure (hypertension). Reduce your risk for type 2 diabetes, heart disease, and stroke. Help with weight loss. What are tips for following this plan? Reading food labels Check food labels for the amount of salt (sodium) per serving. Choose foods with less than 5 percent of the Daily Value (DV) of sodium. In general, foods with less than 300 milligrams (mg) of sodium per serving fit into this eating plan. To find whole grains, look for the word "whole" as the first word in the ingredient list. Shopping Buy products labeled as "low-sodium" or "no salt added." Buy fresh foods. Avoid canned foods and pre-made or frozen meals. Cooking Try not to add salt when you cook. Use salt-free seasonings or herbs instead of table salt or sea salt. Check with your health care provider or pharmacist before using salt substitutes. Do not fry foods. Cook foods in healthy ways, such as baking, boiling, grilling, roasting, or broiling. Cook using oils that are good for your heart. These include olive, canola, avocado, soybean, and sunflower oil. Meal planning  Eat a balanced diet. This should include: 4 or more servings of fruits and 4 or more servings of vegetables each day. Try to fill half of your plate with fruits and vegetables. 6-8 servings of whole grains each day. 6 or less servings of lean meat, poultry, or fish each day. 1 oz is 1 serving. A 3 oz (85 g) serving of meat is about the same size as the palm of your hand. One egg is 1 oz (28 g). 2-3 servings of low-fat dairy each day. One serving is 1 cup (237 mL). 1 serving of nuts, seeds, or beans 5 times each week. 2-3 servings of heart-healthy fats. Healthy fats called  omega-3 fatty acids are found in foods such as walnuts, flaxseeds, fortified milks, and eggs. These fats are also found in cold-water fish, such as sardines, salmon, and mackerel. Limit how much you eat of: Canned or prepackaged foods. Food that is high in trans fat, such as fried foods. Food that is high in saturated fat, such as fatty meat. Desserts and other sweets, sugary drinks, and other foods with added sugar. Full-fat dairy products. Do not salt foods before eating. Do not eat more than 4 egg yolks a week. Try to eat at least 2 vegetarian meals a week. Eat more home-cooked food and less restaurant, buffet, and fast food. Lifestyle When eating at a restaurant, ask if your food can be made with less salt or no salt. If you drink alcohol: Limit how much you have to: 0-1 drink a day if you are female. 0-2 drinks a day if you are female. Know how much alcohol is in your drink. In the [place] one drink is one 12 oz bottle of beer (355 mL), one 5 oz glass of wine (148 mL), or one 1 oz glass of hard liquor (44 mL). General information Avoid eating more than 2,300 mg of salt a day. If you have hypertension, you may need to reduce your sodium intake to 1,500 mg a day. Work with your provider to stay at a healthy body weight or lose weight. Ask what the best weight range is for you. On most days of  the week, get at least 30 minutes of exercise that causes your heart to beat faster. This may include walking, swimming, or biking. Work with your provider or dietitian to adjust your eating plan to meet your specific calorie needs. What foods should I eat? Fruits All fresh, dried, or frozen fruit. Canned fruits that are in their natural juice and do not have sugar added to them. Vegetables Fresh or frozen vegetables that are raw, steamed, roasted, or grilled. Low-sodium or reduced-sodium tomato and vegetable juice. Low-sodium or reduced-sodium tomato sauce and tomato paste. Low-sodium or  reduced-sodium canned vegetables. Grains Whole-grain or whole-wheat bread. Whole-grain or whole-wheat pasta. Brown rice. Orpah Cobb. Bulgur. Whole-grain and low-sodium cereals. Pita bread. Low-fat, low-sodium crackers. Whole-wheat flour tortillas. Meats and other proteins Skinless chicken or [place]. Ground chicken or [place]. Pork with fat trimmed off. Fish and seafood. Egg whites. Dried beans, peas, or lentils. Unsalted nuts, nut butters, and seeds. Unsalted canned beans. Lean cuts of beef with fat trimmed off. Low-sodium, lean precooked or cured meat, such as sausages or meat loaves. Dairy Low-fat (1%) or fat-free (skim) milk. Reduced-fat, low-fat, or fat-free cheeses. Nonfat, low-sodium ricotta or cottage cheese. Low-fat or nonfat yogurt. Low-fat, low-sodium cheese. Fats and oils Soft margarine without trans fats. Vegetable oil. Reduced-fat, low-fat, or light mayonnaise and salad dressings (reduced-sodium). Canola, safflower, olive, avocado, soybean, and sunflower oils. Avocado. Seasonings and condiments Herbs. Spices. Seasoning mixes without salt. Other foods Unsalted popcorn and pretzels. Fat-free sweets. The items listed above may not be all the foods and drinks you can have. Talk to a dietitian to learn more. What foods should I avoid? Fruits Canned fruit in a light or heavy syrup. Fried fruit. Fruit in cream or butter sauce. Vegetables Creamed or fried vegetables. Vegetables in a cheese sauce. Regular canned vegetables that are not marked as low-sodium or reduced-sodium. Regular canned tomato sauce and paste that are not marked as low-sodium or reduced-sodium. Regular tomato and vegetable juices that are not marked as low-sodium or reduced-sodium. Rosita Fire. Olives. Grains Baked goods made with fat, such as croissants, muffins, or some breads. Dry pasta or rice meal packs. Meats and other proteins Fatty cuts of meat. Ribs. Fried meat. Tomasa Blase. Bologna, salami, and other precooked or  cured meats, such as sausages or meat loaves, that are not lean and low in sodium. Fat from the back of a pig (fatback). Bratwurst. Salted nuts and seeds. Canned beans with added salt. Canned or smoked fish. Whole eggs or egg yolks. Chicken or [place] with skin. Dairy Whole or 2% milk, cream, and half-and-half. Whole or full-fat cream cheese. Whole-fat or sweetened yogurt. Full-fat cheese. Nondairy creamers. Whipped toppings. Processed cheese and cheese spreads. Fats and oils Butter. Stick margarine. Lard. Shortening. Ghee. Bacon fat. Tropical oils, such as coconut, palm kernel, or palm oil. Seasonings and condiments Onion salt, garlic salt, seasoned salt, table salt, and sea salt. Worcestershire sauce. Tartar sauce. Barbecue sauce. Teriyaki sauce. Soy sauce, including reduced-sodium soy sauce. Steak sauce. Canned and packaged gravies. Fish sauce. Oyster sauce. Cocktail sauce. Store-bought horseradish. Ketchup. Mustard. Meat flavorings and tenderizers. Bouillon cubes. Hot sauces. Pre-made or packaged marinades. Pre-made or packaged taco seasonings. Relishes. Regular salad dressings. Other foods Salted popcorn and pretzels. The items listed above may not be all the foods and drinks you should avoid. Talk to a dietitian to learn more. Where to find more information National Heart, Lung, and Blood Institute (NHLBI): BuffaloDryCleaner.gl American Heart Association (AHA): heart.org Academy of Nutrition and Dietetics: eatright.org National  Kidney Foundation (NKF): kidney.org This information is not intended to replace advice given to you by your health care provider. Make sure you discuss any questions you have with your health care provider. Document Revised: 11/24/2022 Document Reviewed: 11/24/2022 Elsevier Patient Education  2024 [employer].

## 2023-07-26 NOTE — Assessment & Plan Note (Signed)
Elevated HS-CRP due to hx of UC, in remission with use of mesalamine. Presence of aortic atherosclerosis per radiology report. No tobacco use, no DM We discussed need for statin and maintain heart healthy diet due to high risk for CAD. She agreed to start med.   Repeat lipid panel, LpA, and Hs-CRP. Sent crestor 20mg 

## 2023-07-26 NOTE — Progress Notes (Signed)
Complete physical exam  Patient: Crystal Blair   DOB: 01/05/1951   72 y.o. Female  MRN: [id number] Visit Date: 07/26/2023  Subjective:    Chief Complaint  Patient presents with   Annual Exam    Fasting  HD flu vaccine    Crystal Blair is a 72 y.o. female who presents today for a complete physical exam. She reports consuming a general diet.  Walking daily and balance exercise  She generally feels well. She reports sleeping well. She does have additional problems to discuss today.  Vision:Yes Dental:Yes STD Screen:No  BP Readings from Last 3 Encounters:  07/26/23 120/82  02/09/23 122/82  12/20/22 (!) 140/80   Wt Readings from Last 3 Encounters:  07/26/23 149 lb 9.6 oz (67.9 kg)  02/09/23 146 lb (66.2 kg)  12/20/22 142 lb (64.4 kg)   Most recent fall risk assessment:    07/26/2023    9:56 AM  Fall Risk   Falls in the past year? 1  Number falls in past yr: 0  Injury with Fall? 1  Risk for fall due to : History of fall(s)  Follow up Falls evaluation completed   Depression screen:Yes - No Depression  Most recent depression screenings:    07/26/2023    9:56 AM 09/29/2022   11:57 AM  PHQ 2/9 Scores  PHQ - 2 Score 0 2  PHQ- 9 Score 2 5    HPI  Pure hypercholesterolemia Elevated HS-CRP due to hx of UC, in remission with use of mesalamine. Presence of aortic atherosclerosis per radiology report. No tobacco use, no DM We discussed need for statin and maintain heart healthy diet due to high risk for CAD. She agreed to start med.   Repeat lipid panel, LpA, and Hs-CRP. Sent crestor 20mg   Plantar fasciitis Acute on chronic, alternates from right to left foot depending on type of shoe and duration of weight bearing activity.  Entered referral to podiatry per her request. Advised to use shoes with adequate support and use of cold compress daily in AM  Past Medical History:  Diagnosis Date   Arthritis    Diverticulosis    History of chicken pox    History of  gallstones    Tubular adenoma 2015   Per Colonoscopy/q39yrs   Ulcerative colitis (HCC)    Past Surgical History:  Procedure Laterality Date   BREAST BIOPSY Left 2015   BREAST EXCISIONAL BIOPSY Left    CHOLECYSTECTOMY  1991   COLONOSCOPY     around 2016   ESOPHAGOGASTRODUODENOSCOPY     around 1993. Dr Vanessa Ralphs.    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   RETINAL TEAR REPAIR CRYOTHERAPY Right 05/18/2020   Social History   Socioeconomic History   Marital status: Widowed    Spouse name: Not on file   Number of children: 2   Years of education: Not on file   Highest education level: Not on file  Occupational History   Occupation: works part time  Tobacco Use   Smoking status: Never   Smokeless tobacco: Never  Vaping Use   Vaping status: Never Used  Substance and Sexual Activity   Alcohol use: Yes    Comment: occasional   Drug use: Never   Sexual activity: Yes  Other Topics Concern   Not on file  Social History Narrative   Not on file   Social Determinants of Health   Financial Resource Strain: Low Risk  (07/12/2022)   Overall Financial Resource Strain (CARDIA)  Difficulty of Paying Living Expenses: Not hard at all  Food Insecurity: No Food Insecurity (07/12/2022)   Hunger Vital Sign    Worried About Running Out of Food in the Last Year: Never true    Ran Out of Food in the Last Year: Never true  Transportation Needs: No Transportation Needs (07/12/2022)   PRAPARE - Administrator, Civil Service (Medical): No    Lack of Transportation (Non-Medical): No  Physical Activity: Insufficiently Active (07/12/2022)   Exercise Vital Sign    Days of Exercise per Week: 3 days    Minutes of Exercise per Session: 30 min  Stress: No Stress Concern Present (07/12/2022)   [employer] of Occupational Health - Occupational Stress Questionnaire    Feeling of Stress : Not at all  Social Connections: Moderately Integrated (07/12/2022)   Social Connection and Isolation  Panel [NHANES]    Frequency of Communication with Friends and Family: Three times a week    Frequency of Social Gatherings with Friends and Family: Three times a week    Attends Religious Services: Never    Active Member of Clubs or Organizations: Yes    Attends Banker Meetings: 1 to 4 times per year    Marital Status: Living with partner  Intimate Partner Violence: Not At Risk (07/12/2022)   Humiliation, Afraid, Rape, and Kick questionnaire    Fear of Current or Ex-Partner: No    Emotionally Abused: No    Physically Abused: No    Sexually Abused: No   Family Status  Relation Name Status   Mother  Deceased   Father  Deceased   Sister Jenna Luo   MGM  Deceased   MGF  Deceased   PGM  Deceased   PGF  Deceased   Son  Deceased   Son  Alive   Mat Aunt  (Not Specified)   Neg Hx  (Not Specified)  No partnership data on file   Family History  Problem Relation Age of Onset   Arthritis Mother    COPD Mother    Depression Mother    Alcohol abuse Father    Early death Father 63   Hyperlipidemia Father    Hypertension Father    Hypertension Sister    Bladder Cancer Sister    Crohn's disease Son    Colon cancer Maternal Aunt    Esophageal cancer Neg Hx    Allergies  Allergen Reactions   Penicillin G Other (See Comments)    As a kid had a reaction    Mango Flavor     Patient Care Team: Mandy Peeks, Bonna Gains, NP as PCP - General (Internal Medicine) Janalyn Harder, MD (Inactive) as Consulting Physician (Dermatology)   Medications: Outpatient Medications Prior to Visit  Medication Sig   azelastine (ASTELIN) 0.1 % nasal spray Place 1 spray into both nostrils 2 (two) times daily. Use in each nostril as directed   Bioflavonoid Products (VITAMIN C) CHEW Chew 1 tablet by mouth daily in the afternoon.   dicyclomine (BENTYL) 20 MG tablet Take 1 tablet (20 mg total) by mouth 3 (three) times daily as needed for spasms.   fluticasone (FLONASE) 50 MCG/ACT nasal spray Place  2 sprays into both nostrils daily. (Patient taking differently: Place 2 sprays into both nostrils daily as needed.)   hyoscyamine (ANASPAZ) 0.125 MG TBDP disintergrating tablet Place 0.125 mg under the tongue every 4 (four) hours as needed.   mesalamine (APRISO) 0.375 g 24 hr capsule Take 4 capsules (  1.5 g total) by mouth daily.   Multiple Vitamin (MULTIVITAMIN) tablet Take 1 tablet by mouth daily.   [DISCONTINUED] nystatin (MYCOSTATIN) 100000 UNIT/ML suspension Take 5 mLs (500,000 Units total) by mouth 4 (four) times daily. (Patient not taking: Reported on 02/09/2023)   No facility-administered medications prior to visit.   Review of Systems  Constitutional:  Negative for activity change, appetite change and unexpected weight change.  Respiratory: Negative.    Cardiovascular: Negative.   Gastrointestinal: Negative.   Endocrine: Negative for cold intolerance and heat intolerance.  Genitourinary: Negative.   Musculoskeletal: Negative.   Skin: Negative.   Neurological: Negative.   Hematological: Negative.   Psychiatric/Behavioral:  Negative for behavioral problems, decreased concentration, dysphoric mood, hallucinations, self-injury, sleep disturbance and suicidal ideas. The patient is not nervous/anxious.         Objective:  BP 120/82 (BP Location: Left Arm, Patient Position: Sitting, Cuff Size: Normal)   Pulse 80   Temp (!) 97.5 F (36.4 C) (Temporal)   Resp 16   Ht 5' 3.5" (1.613 m)   Wt 149 lb 9.6 oz (67.9 kg)   SpO2 99%   BMI 26.08 kg/m     Physical Exam Vitals and nursing note reviewed.  Constitutional:      General: She is not in acute distress. HENT:     Right Ear: Tympanic membrane, ear canal and external ear normal. No decreased hearing noted.     Left Ear: Tympanic membrane, ear canal and external ear normal. No decreased hearing noted.     Nose: Nose normal.  Eyes:     Extraocular Movements: Extraocular movements intact.     Conjunctiva/sclera: Conjunctivae  normal.     Pupils: Pupils are equal, round, and reactive to light.  Neck:     Thyroid: No thyroid mass, thyromegaly or thyroid tenderness.  Cardiovascular:     Rate and Rhythm: Normal rate and regular rhythm.     Pulses: Normal pulses.     Heart sounds: Normal heart sounds.  Pulmonary:     Effort: Pulmonary effort is normal.     Breath sounds: Normal breath sounds.  Abdominal:     General: Bowel sounds are normal.     Palpations: Abdomen is soft.  Musculoskeletal:        General: Normal range of motion.     Cervical back: Normal range of motion and neck supple.     Right lower leg: No edema.     Left lower leg: No edema.  Lymphadenopathy:     Cervical: No cervical adenopathy.  Skin:    General: Skin is warm and dry.  Neurological:     Mental Status: She is alert and oriented to person, place, and time.     Cranial Nerves: No cranial nerve deficit.  Psychiatric:        Mood and Affect: Mood normal.        Behavior: Behavior normal.        Thought Content: Thought content normal.      No results found for any visits on 07/26/23.    Assessment & Plan:    Routine Health Maintenance and Physical Exam  Immunization History  Administered Date(s) Administered   COVID-19, mRNA, vaccine(Comirnaty)12 years and older 10/27/2022   Fluad Quad(high Dose 65+) 07/31/2019, 09/29/2022   Fluad Trivalent(High Dose 65+) 07/26/2023   Influenza, High Dose Seasonal PF 08/09/2021   Influenza-Unspecified 10/19/2020, 08/09/2021   PFIZER(Purple Top)SARS-COV-2 Vaccination 01/12/2020, 02/05/2020, 09/01/2020, 08/09/2021   PNEUMOCOCCAL CONJUGATE-20 07/20/2022  Pneumococcal Conjugate-13 09/11/2019   Tdap 12/30/2021   Health Maintenance  Topic Date Due   Zoster Vaccines- Shingrix (1 of 2) Never done   Medicare Annual Wellness (AWV)  07/13/2023   COVID-19 Vaccine (6 - 2023-24 season) 07/23/2023   MAMMOGRAM  12/27/2023   Colonoscopy  08/06/2025   DTaP/Tdap/Td (2 - Td or Tdap) 12/31/2031    Pneumonia Vaccine 58+ Years old  Completed   INFLUENZA VACCINE  Completed   DEXA SCAN  Completed   Hepatitis C Screening  Completed   HPV VACCINES  Aged Out   Discussed health benefits of physical activity, and encouraged her to engage in regular exercise appropriate for her age and condition.  Problem List Items Addressed This Visit     Aortic atherosclerosis (HCC)   Relevant Medications   rosuvastatin (CRESTOR) 20 MG tablet   Other Relevant Orders   Lipid panel   Lipoprotein A (LPA)   Elevated C-reactive protein (CRP)   Relevant Orders   CRP High sensitivity   Sedimentation rate   Plantar fasciitis    Acute on chronic, alternates from right to left foot depending on type of shoe and duration of weight bearing activity.  Entered referral to podiatry per her request. Advised to use shoes with adequate support and use of cold compress daily in AM      Relevant Orders   Ambulatory referral to Podiatry   Pure hypercholesterolemia    Elevated HS-CRP due to hx of UC, in remission with use of mesalamine. Presence of aortic atherosclerosis per radiology report. No tobacco use, no DM We discussed need for statin and maintain heart healthy diet due to high risk for CAD. She agreed to start med.   Repeat lipid panel, LpA, and Hs-CRP. Sent crestor 20mg       Relevant Medications   rosuvastatin (CRESTOR) 20 MG tablet   Tinnitus aurium, bilateral   Relevant Orders   Ambulatory referral to Audiology   Other Visit Diagnoses     Encounter for preventative adult health care exam with abnormal findings    -  Primary   Relevant Orders   Basic metabolic panel   Encounter for immunization       Relevant Orders   Flu Vaccine Trivalent High Dose (Fluad) (Completed)      Return in about 6 months (around 01/23/2024) for hyperlipidemia (fasting).     Alysia Penna, NP

## 2023-07-27 ENCOUNTER — Telehealth: Payer: Self-pay | Admitting: Nurse Practitioner

## 2023-07-27 NOTE — Telephone Encounter (Signed)
 Called and  talked to patient.  Made her aware of Charlotte's instructions

## 2023-07-27 NOTE — Telephone Encounter (Signed)
Caller Name: [facility] Call back phone #: [phone]  REFERRAL  Preferred provider/office: [facility] [address] [place] [address]   Misc Documentation: This pts insurance is OON

## 2023-08-02 DIAGNOSIS — L821 Other seborrheic keratosis: Secondary | ICD-10-CM | POA: Diagnosis not present

## 2023-08-02 DIAGNOSIS — D492 Neoplasm of unspecified behavior of bone, soft tissue, and skin: Secondary | ICD-10-CM | POA: Diagnosis not present

## 2023-08-03 ENCOUNTER — Encounter: Payer: Self-pay | Admitting: Nurse Practitioner

## 2023-08-03 ENCOUNTER — Other Ambulatory Visit: Payer: Self-pay

## 2023-08-03 DIAGNOSIS — I700 Atherosclerosis of aorta: Secondary | ICD-10-CM

## 2023-08-03 NOTE — Telephone Encounter (Signed)
Called patient and she will come into the office on Monday 08/07/23 for redraw of lab. She thanked me for calling.

## 2023-08-04 LAB — LIPOPROTEIN A (LPA): Lipoprotein (a): <10 nmol/L (ref <75)

## 2023-08-04 LAB — EXTRA LAV TOP TUBE

## 2023-08-07 ENCOUNTER — Other Ambulatory Visit: Payer: Medicare Other

## 2023-08-15 ENCOUNTER — Encounter: Payer: Self-pay | Admitting: Nurse Practitioner

## 2023-08-23 ENCOUNTER — Other Ambulatory Visit (INDEPENDENT_AMBULATORY_CARE_PROVIDER_SITE_OTHER): Payer: Medicare Other

## 2023-08-23 ENCOUNTER — Encounter: Payer: Self-pay | Admitting: Nurse Practitioner

## 2023-08-23 ENCOUNTER — Encounter: Payer: Medicare Other | Admitting: Nurse Practitioner

## 2023-08-23 ENCOUNTER — Other Ambulatory Visit: Payer: Self-pay | Admitting: Nurse Practitioner

## 2023-08-23 DIAGNOSIS — E7800 Pure hypercholesterolemia, unspecified: Secondary | ICD-10-CM | POA: Diagnosis not present

## 2023-08-23 DIAGNOSIS — I700 Atherosclerosis of aorta: Secondary | ICD-10-CM | POA: Diagnosis not present

## 2023-08-23 LAB — HEPATIC FUNCTION PANEL
ALT: 18 U/L (ref 0–35)
AST: 21 U/L (ref 0–37)
Albumin: 4 g/dL (ref 3.5–5.2)
Alkaline Phosphatase: 48 U/L (ref 39–117)
Bilirubin, Direct: 0.2 mg/dL (ref 0.0–0.3)
Total Bilirubin: 0.8 mg/dL (ref 0.2–1.2)
Total Protein: 6.5 g/dL (ref 6.0–8.3)

## 2023-08-23 LAB — LIPID PANEL
Cholesterol: 117 mg/dL (ref 0–200)
HDL: 66 mg/dL (ref >39.00)
LDL Cholesterol: 36 mg/dL (ref 0–99)
NonHDL: 50.93
Total CHOL/HDL Ratio: 2
Triglycerides: 77 mg/dL (ref 0.0–149.0)
VLDL: 15.4 mg/dL (ref 0.0–40.0)

## 2023-08-23 LAB — CK: Total CK: 407 U/L — ABNORMAL HIGH (ref 7–177)

## 2023-08-23 MED ORDER — ROSUVASTATIN CALCIUM 20 MG PO TABS
20.0000 mg | ORAL_TABLET | ORAL | 3 refills | Status: DC
Start: 2023-08-24 — End: 2024-04-17

## 2023-08-23 NOTE — Assessment & Plan Note (Signed)
Reports fatigue with crestor Check CK: elevated at 407 Repeat lipid panel: LDL at goal Repeat hepatic panel: normal Hold med x 2weeks, then start taking crestor 1tab 2x/week.

## 2023-08-23 NOTE — Addendum Note (Signed)
Addended by: Alysia Penna L on: 08/23/2023 02:58 PM   Modules accepted: Orders

## 2023-08-24 NOTE — Progress Notes (Signed)
Lab appointment needed This encounter was created in error - please disregard.

## 2023-08-30 ENCOUNTER — Ambulatory Visit (INDEPENDENT_AMBULATORY_CARE_PROVIDER_SITE_OTHER): Payer: Medicare Other | Admitting: Podiatry

## 2023-08-30 ENCOUNTER — Ambulatory Visit (INDEPENDENT_AMBULATORY_CARE_PROVIDER_SITE_OTHER): Payer: Medicare Other

## 2023-08-30 ENCOUNTER — Encounter: Payer: Self-pay | Admitting: Podiatry

## 2023-08-30 VITALS — BP 152/53 | HR 78

## 2023-08-30 DIAGNOSIS — M722 Plantar fascial fibromatosis: Secondary | ICD-10-CM

## 2023-08-30 NOTE — Progress Notes (Signed)
Subjective:  Patient ID: Crystal Blair, female    DOB: 1951/11/12,  MRN: [id number]  Chief Complaint  Patient presents with   Foot Pain    "I think I have Plantar Fasciitis on my right heel.  I want him to look at my toes on my right foot, they are deformed."    72 y.o. female presents with the above complaint.  Patient presents with right heel pain that has been going for quite some time is progressive and worse.  She wanted me to take a look at it.  She has not seen and was prior to severe pain scale 7 out of 10 dull aching nature.  Hurts with ambulation hurts with pressure.   Review of Systems: Negative except as noted in the HPI. Denies N/V/F/Ch.  Past Medical History:  Diagnosis Date   Arthritis    Diverticulosis    History of chicken pox    History of gallstones    Tubular adenoma 2015   Per Colonoscopy/q21yrs   Ulcerative colitis (HCC)     Current Outpatient Medications:    azelastine (ASTELIN) 0.1 % nasal spray, Place 1 spray into both nostrils 2 (two) times daily. Use in each nostril as directed, Disp: 30 mL, Rfl: 1   Bioflavonoid Products (VITAMIN C) CHEW, Chew 1 tablet by mouth daily in the afternoon., Disp: , Rfl:    dicyclomine (BENTYL) 20 MG tablet, Take 1 tablet (20 mg total) by mouth 3 (three) times daily as needed for spasms., Disp: 50 tablet, Rfl: 0   fluticasone (FLONASE) 50 MCG/ACT nasal spray, Place 2 sprays into both nostrils daily. (Patient taking differently: Place 2 sprays into both nostrils daily as needed.), Disp: 16 g, Rfl: 1   hyoscyamine (ANASPAZ) 0.125 MG TBDP disintergrating tablet, Place 0.125 mg under the tongue every 4 (four) hours as needed., Disp: , Rfl:    mesalamine (APRISO) 0.375 g 24 hr capsule, Take 4 capsules (1.5 g total) by mouth daily., Disp: 360 capsule, Rfl: 3   Multiple Vitamin (MULTIVITAMIN) tablet, Take 1 tablet by mouth daily., Disp: , Rfl:    rosuvastatin (CRESTOR) 20 MG tablet, Take 1 tablet (20 mg total) by mouth 2 (two)  times a week., Disp: 12 tablet, Rfl: 3  Social History   Tobacco Use  Smoking Status Never  Smokeless Tobacco Never    Allergies  Allergen Reactions   Penicillin G Other (See Comments)    As a kid had a reaction    Mango Flavor    Objective:   Vitals:   08/30/23 0933  BP: (!) 152/53  Pulse: 78   There is no height or weight on file to calculate BMI. Constitutional Well developed. Well nourished.  Vascular Dorsalis pedis pulses palpable bilaterally. Posterior tibial pulses palpable bilaterally. Capillary refill normal to all digits.  No cyanosis or clubbing noted. Pedal hair growth normal.  Neurologic Normal speech. Oriented to person, place, and time. Epicritic sensation to light touch grossly present bilaterally.  Dermatologic Nails well groomed and normal in appearance. No open wounds. No skin lesions.  Orthopedic: Normal joint ROM without pain or crepitus bilaterally. No visible deformities. Tender to palpation at the calcaneal tuber right. No pain with calcaneal squeeze right. Ankle ROM diminished range of motion right. Silfverskiold Test: positive right.   Radiographs: Taken and reviewed. No acute fractures or dislocations. No evidence of stress fracture.  Plantar heel spur absent. Posterior heel spur absent.  Pes cavus foot structure  Assessment:   1. Plantar fasciitis  of right foot    Plan:  Patient was evaluated and treated and all questions answered.  Plantar Fasciitis, right - XR reviewed as above.  - Educated on icing and stretching. Instructions given.  - Injection delivered to the plantar fascia as below. - DME: Plantar fascial brace dispensed to support the medial longitudinal arch of the foot and offload pressure from the heel and prevent arch collapse during weightbearing - Pharmacologic management: None  Procedure: Injection Tendon/Ligament Location: Right plantar fascia at the glabrous junction; medial approach. Skin Prep:  alcohol Injectate: 0.5 cc 0.5% marcaine plain, 0.5 cc of 1% Lidocaine, 0.5 cc kenalog 10. Disposition: Patient tolerated procedure well. Injection site dressed with a band-aid.  No follow-ups on file.

## 2023-08-31 ENCOUNTER — Telehealth: Payer: Self-pay | Admitting: Podiatry

## 2023-08-31 NOTE — Telephone Encounter (Signed)
Notified pt of what Dr Allena Katz said and she understood and will keep monitoring it.

## 2023-08-31 NOTE — Telephone Encounter (Signed)
Pt called and had the injection yesterday and woke up this morning with her face and chest flush. She said it did spread a little more when she was moving around. Is this normal?

## 2023-09-15 ENCOUNTER — Ambulatory Visit (INDEPENDENT_AMBULATORY_CARE_PROVIDER_SITE_OTHER): Payer: Medicare Other

## 2023-09-15 DIAGNOSIS — Z0000 Encounter for general adult medical examination without abnormal findings: Secondary | ICD-10-CM | POA: Diagnosis not present

## 2023-09-15 NOTE — Progress Notes (Signed)
Subjective:   Crystal Blair is a 72 y.o. female who presents for Medicare Annual (Subsequent) preventive examination.  Visit Complete: Virtual I connected with  Quinn Plowman on 09/15/23 by a audio enabled telemedicine application and verified that I am speaking with the correct person using two identifiers.  Patient Location: Home  Provider Location: Office/Clinic  I discussed the limitations of evaluation and management by telemedicine. The patient expressed understanding and agreed to proceed.  Vital Signs: Because this visit was a virtual/telehealth visit, some criteria may be missing or patient reported. Any vitals not documented were not able to be obtained and vitals that have been documented are patient reported.    Cardiac Risk Factors include: advanced age (>29men, >93 women)     Objective:    Today's Vitals   09/15/23 1456  PainSc: 2    There is no height or weight on file to calculate BMI.     09/15/2023    3:03 PM 07/12/2022    8:28 AM 12/30/2021   12:42 PM 12/11/2021    1:10 PM 07/06/2021    9:22 AM 06/25/2020    8:42 AM  Advanced Directives  Does Patient Have a Medical Advance Directive? Yes Yes Yes Yes Yes No  Type of Estate agent of [place];Living will Healthcare Power of [place];Living will   Healthcare Power of Attorney   Copy of Healthcare Power of Attorney in Chart? No - copy requested No - copy requested   No - copy requested   Would patient like information on creating a medical advance directive?      Yes (MAU/Ambulatory/Procedural Areas - Information given)    Current Medications (verified) Outpatient Encounter Medications as of 09/15/2023  Medication Sig   azelastine (ASTELIN) 0.1 % nasal spray Place 1 spray into both nostrils 2 (two) times daily. Use in each nostril as directed   Bioflavonoid Products (VITAMIN C) CHEW Chew 1 tablet by mouth daily in the afternoon.   dicyclomine (BENTYL) 20 MG tablet Take 1 tablet  (20 mg total) by mouth 3 (three) times daily as needed for spasms.   fluticasone (FLONASE) 50 MCG/ACT nasal spray Place 2 sprays into both nostrils daily. (Patient taking differently: Place 2 sprays into both nostrils daily as needed.)   hyoscyamine (ANASPAZ) 0.125 MG TBDP disintergrating tablet Place 0.125 mg under the tongue every 4 (four) hours as needed.   mesalamine (APRISO) 0.375 g 24 hr capsule Take 4 capsules (1.5 g total) by mouth daily.   Multiple Vitamin (MULTIVITAMIN) tablet Take 1 tablet by mouth daily.   rosuvastatin (CRESTOR) 20 MG tablet Take 1 tablet (20 mg total) by mouth 2 (two) times a week.   No facility-administered encounter medications on file as of 09/15/2023.    Allergies (verified) Penicillin g and Mango flavor   History: Past Medical History:  Diagnosis Date   Arthritis    Diverticulosis    History of chicken pox    History of gallstones    Tubular adenoma 2015   Per Colonoscopy/q69yrs   Ulcerative colitis (HCC)    Past Surgical History:  Procedure Laterality Date   BREAST BIOPSY Left 2015   BREAST EXCISIONAL BIOPSY Left    CHOLECYSTECTOMY  1991   COLONOSCOPY     around 2016   ESOPHAGOGASTRODUODENOSCOPY     around 1993. Dr Vanessa Ralphs.    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   RETINAL TEAR REPAIR CRYOTHERAPY Right 05/18/2020   Family History  Problem Relation Age of Onset  Arthritis Mother    COPD Mother    Depression Mother    Alcohol abuse Father    Early death Father 49   Hyperlipidemia Father    Hypertension Father    Hypertension Sister    Bladder Cancer Sister    Crohn's disease Son    Colon cancer Maternal Aunt    Esophageal cancer Neg Hx    Social History   Socioeconomic History   Marital status: Widowed    Spouse name: Not on file   Number of children: 2   Years of education: Not on file   Highest education level: Not on file  Occupational History   Occupation: works part time  Tobacco Use   Smoking status: Never    Smokeless tobacco: Never  Vaping Use   Vaping status: Never Used  Substance and Sexual Activity   Alcohol use: Yes    Alcohol/week: 1.0 standard drink of alcohol    Types: 1 Glasses of wine per week    Comment: daily with dinner   Drug use: Never   Sexual activity: Yes  Other Topics Concern   Not on file  Social History Narrative   Not on file   Social Determinants of Health   Financial Resource Strain: Low Risk  (09/15/2023)   Overall Financial Resource Strain (CARDIA)    Difficulty of Paying Living Expenses: Not hard at all  Food Insecurity: No Food Insecurity (09/15/2023)   Hunger Vital Sign    Worried About Running Out of Food in the Last Year: Never true    Ran Out of Food in the Last Year: Never true  Transportation Needs: No Transportation Needs (09/15/2023)   PRAPARE - Administrator, Civil Service (Medical): No    Lack of Transportation (Non-Medical): No  Physical Activity: Insufficiently Active (09/15/2023)   Exercise Vital Sign    Days of Exercise per Week: 3 days    Minutes of Exercise per Session: 30 min  Stress: Stress Concern Present (09/15/2023)   [employer] of Occupational Health - Occupational Stress Questionnaire    Feeling of Stress : To some extent  Social Connections: Moderately Integrated (09/15/2023)   Social Connection and Isolation Panel [NHANES]    Frequency of Communication with Friends and Family: More than three times a week    Frequency of Social Gatherings with Friends and Family: More than three times a week    Attends Religious Services: Never    Database administrator or Organizations: Yes    Attends Engineer, structural: More than 4 times per year    Marital Status: Living with partner  Recent Concern: Social Connections - Moderately Isolated (09/15/2023)   Social Connection and Isolation Panel [NHANES]    Frequency of Communication with Friends and Family: More than three times a week    Frequency of Social  Gatherings with Friends and Family: More than three times a week    Attends Religious Services: Never    Database administrator or Organizations: Yes    Attends Engineer, structural: More than 4 times per year    Marital Status: Widowed    Tobacco Counseling Counseling given: Not Answered   Clinical Intake:  Pre-visit preparation completed: Yes  Pain : 0-10 Pain Score: 2  Pain Type: Chronic pain Pain Location: Foot Pain Orientation: Left, Right Pain Onset: More than a month ago Pain Frequency: Constant     Nutritional Risks: None Diabetes: No  How often do you  need to have someone help you when you read instructions, pamphlets, or other written materials from your doctor or pharmacy?: 1 - Never  Interpreter Needed?: No  Information entered by :: NAllen LPN   Activities of Daily Living    09/15/2023    2:57 PM  In your present state of health, do you have any difficulty performing the following activities:  Hearing? 0  Vision? 0  Difficulty concentrating or making decisions? 0  Walking or climbing stairs? 0  Dressing or bathing? 0  Doing errands, shopping? 0  Preparing Food and eating ? N  Using the Toilet? N  In the past six months, have you accidently leaked urine? N  Do you have problems with loss of bowel control? N  Managing your Medications? N  Managing your Finances? N  Housekeeping or managing your Housekeeping? N    Patient Care Team: Nche, Bonna Gains, NP as PCP - General (Internal Medicine) Janalyn Harder, MD (Inactive) as Consulting Physician (Dermatology)  Indicate any recent Medical Services you may have received from other than [facility] providers in the past year (date may be approximate).     Assessment:   This is a routine wellness examination for Crystal Blair.  Hearing/Vision screen Hearing Screening - Comments:: Denies hearing issues Vision Screening - Comments:: Regular eye exams, [facility]   Goals Addressed              This Visit's Progress    Patient Stated       09/15/2023, eat healthy and exercise more       Depression Screen    09/15/2023    3:05 PM 07/26/2023    9:56 AM 09/29/2022   11:57 AM 07/20/2022    9:25 AM 07/12/2022    8:29 AM 07/12/2022    8:26 AM 07/16/2021   11:00 AM  PHQ 2/9 Scores  PHQ - 2 Score 0 0 2 0 0 0 0  PHQ- 9 Score 0 2 5 2   1     Fall Risk    09/15/2023    3:04 PM 07/26/2023    9:56 AM 09/29/2022   11:04 AM 07/20/2022    8:26 AM 07/12/2022    8:30 AM  Fall Risk   Falls in the past year? 0 1 1 0 0  Number falls in past yr: 0 0 0 0 0  Injury with Fall? 0 1 1 0 0  Risk for fall due to : Medication side effect History of fall(s)     Follow up Falls prevention discussed;Falls evaluation completed Falls evaluation completed   Falls evaluation completed;Education provided    MEDICARE RISK AT HOME: Medicare Risk at Home Any stairs in or around the home?: No If so, are there any without handrails?: No Home free of loose throw rugs in walkways, pet beds, electrical cords, etc?: Yes Adequate lighting in your home to reduce risk of falls?: Yes Life alert?: No Use of a cane, walker or w/c?: No Grab bars in the bathroom?: Yes Shower chair or bench in shower?: No Elevated toilet seat or a handicapped toilet?: No  TIMED UP AND GO:  Was the test performed?  No    Cognitive Function:        09/15/2023    3:06 PM 07/12/2022    8:37 AM  6CIT Screen  What Year? 0 points 0 points  What month? 0 points 0 points  What time? 0 points 0 points  Count back from 20 0 points 0  points  Months in reverse 0 points 0 points  Repeat phrase 0 points 0 points  Total Score 0 points 0 points    Immunizations Immunization History  Administered Date(s) Administered   Fluad Quad(high Dose 65+) 07/31/2019, 09/29/2022   Fluad Trivalent(High Dose 65+) 07/26/2023   Influenza, High Dose Seasonal PF 08/09/2021   Influenza-Unspecified 10/19/2020, 08/09/2021   PFIZER(Purple Top)SARS-COV-2  Vaccination 01/12/2020, 02/05/2020, 09/01/2020, 08/09/2021   PNEUMOCOCCAL CONJUGATE-20 07/20/2022   Pfizer(Comirnaty)Fall Seasonal Vaccine 12 years and older 10/27/2022   Pneumococcal Conjugate-13 09/11/2019   Tdap 12/30/2021    TDAP status: Up to date  Flu Vaccine status: Up to date  Pneumococcal vaccine status: Up to date  Covid-19 vaccine status: Information provided on how to obtain vaccines.   Qualifies for Shingles Vaccine? Yes   Zostavax completed No   Shingrix Completed?: No.    Education has been provided regarding the importance of this vaccine. Patient has been advised to call insurance company to determine out of pocket expense if they have not yet received this vaccine. Advised may also receive vaccine at local pharmacy or Health Dept. Verbalized acceptance and understanding.  Screening Tests Health Maintenance  Topic Date Due   Zoster Vaccines- Shingrix (1 of 2) Never done   COVID-19 Vaccine (6 - 2023-24 season) 07/23/2023   MAMMOGRAM  12/27/2023   Medicare Annual Wellness (AWV)  09/14/2024   Colonoscopy  08/06/2025   DTaP/Tdap/Td (2 - Td or Tdap) 12/31/2031   Pneumonia Vaccine 52+ Years old  Completed   INFLUENZA VACCINE  Completed   DEXA SCAN  Completed   Hepatitis C Screening  Completed   HPV VACCINES  Aged Out    Health Maintenance  Health Maintenance Due  Topic Date Due   Zoster Vaccines- Shingrix (1 of 2) Never done   COVID-19 Vaccine (6 - 2023-24 season) 07/23/2023    Colorectal cancer screening: Type of screening: Colonoscopy. Completed 08/06/2020. Repeat every 5 years  Mammogram status: Completed 12/26/2022. Repeat every year  Bone Density status: Completed 04/26/2022.   Lung Cancer Screening: (Low Dose CT Chest recommended if Age 61-80 years, 20 pack-year currently smoking OR have quit w/in 15years.) does not qualify.   Lung Cancer Screening Referral: no  Additional Screening:  Hepatitis C Screening: does qualify; Completed  09/11/2019  Vision Screening: Recommended annual ophthalmology exams for early detection of glaucoma and other disorders of the eye. Is the patient up to date with their annual eye exam?  Yes  Who is the provider or what is the name of the office in which the patient attends annual eye exams? [facility] If pt is not established with a provider, would they like to be referred to a provider to establish care? No .   Dental Screening: Recommended annual dental exams for proper oral hygiene  Diabetic Foot Exam: n/a  Community Resource Referral / Chronic Care Management: CRR required this visit?  No   CCM required this visit?  No     Plan:     I have personally reviewed and noted the following in the patient's chart:   Medical and social history Use of alcohol, tobacco or illicit drugs  Current medications and supplements including opioid prescriptions. Patient is not currently taking opioid prescriptions. Functional ability and status Nutritional status Physical activity Advanced directives List of other physicians Hospitalizations, surgeries, and ER visits in previous 12 months Vitals Screenings to include cognitive, depression, and falls Referrals and appointments  In addition, I have reviewed and discussed with  patient certain preventive protocols, quality metrics, and best practice recommendations. A written personalized care plan for preventive services as well as general preventive health recommendations were provided to patient.     Barb Merino, LPN   13/06/6577   After Visit Summary: (MyChart) Due to this being a telephonic visit, the after visit summary with patients personalized plan was offered to patient via MyChart   Nurse Notes: none

## 2023-09-15 NOTE — Patient Instructions (Signed)
Ms. Duensing , Thank you for taking time to come for your Medicare Wellness Visit. I appreciate your ongoing commitment to your health goals. Please review the following plan we discussed and let me know if I can assist you in the future.   Referrals/Orders/Follow-Ups/Clinician Recommendations: none  This is a list of the screening recommended for you and due dates:  Health Maintenance  Topic Date Due   Zoster (Shingles) Vaccine (1 of 2) Never done   COVID-19 Vaccine (6 - 2023-24 season) 07/23/2023   Mammogram  12/27/2023   Medicare Annual Wellness Visit  09/14/2024   Colon Cancer Screening  08/06/2025   DTaP/Tdap/Td vaccine (2 - Td or Tdap) 12/31/2031   Pneumonia Vaccine  Completed   Flu Shot  Completed   DEXA scan (bone density measurement)  Completed   Hepatitis C Screening  Completed   HPV Vaccine  Aged Out    Advanced directives: (Copy Requested) Please bring a copy of your health care power of attorney and living will to the office to be added to your chart at your convenience.  Next Medicare Annual Wellness Visit scheduled for next year: Yes  Insert Preventive Care attachment Insert FALL PREVENTION attachment if needed

## 2023-09-27 ENCOUNTER — Ambulatory Visit: Payer: Medicare Other | Admitting: Podiatry

## 2023-09-27 ENCOUNTER — Encounter: Payer: Self-pay | Admitting: Podiatry

## 2023-09-27 DIAGNOSIS — M62461 Contracture of muscle, right lower leg: Secondary | ICD-10-CM

## 2023-09-27 DIAGNOSIS — M722 Plantar fascial fibromatosis: Secondary | ICD-10-CM

## 2023-09-27 NOTE — Progress Notes (Signed)
Subjective:  Patient ID: Crystal Blair, female    DOB: 02/23/1951,  MRN: [id number]  Chief Complaint  Patient presents with   Routine Post Op    F/U FOR PLANTAR FASCIITIS : PATIENT STATES THAT IT IS STARTING TO GET BETTER , IT HAS BEEN BETTER FOR THE MOST PART AND SHE IS TRYING TO WEAR BETTER SHOES.  SOMETIMES IT STARTS TO HURT WHEN SHE TAKES THE WEIGHT OFF THE RF IT FEELS LIKE IT HURTS , LIKE IT IS THROBBING .   SHE WILL LIKE TO KNOW SHOULD SHE USE A CANE WHEN IT HURTS?     72 y.o. female presents with the above complaint.  Patient presents with follow-up of left plantar fasciitis.  She states that she is doing a lot better.  The injection helped.  She still has some throbbing pain.  She denies any other acute complaints.   Review of Systems: Negative except as noted in the HPI. Denies N/V/F/Ch.  Past Medical History:  Diagnosis Date   Arthritis    Diverticulosis    History of chicken pox    History of gallstones    Tubular adenoma 2015   Per Colonoscopy/q28yrs   Ulcerative colitis (HCC)     Current Outpatient Medications:    azelastine (ASTELIN) 0.1 % nasal spray, Place 1 spray into both nostrils 2 (two) times daily. Use in each nostril as directed, Disp: 30 mL, Rfl: 1   Bioflavonoid Products (VITAMIN C) CHEW, Chew 1 tablet by mouth daily in the afternoon., Disp: , Rfl:    dicyclomine (BENTYL) 20 MG tablet, Take 1 tablet (20 mg total) by mouth 3 (three) times daily as needed for spasms., Disp: 50 tablet, Rfl: 0   fluticasone (FLONASE) 50 MCG/ACT nasal spray, Place 2 sprays into both nostrils daily. (Patient taking differently: Place 2 sprays into both nostrils daily as needed.), Disp: 16 g, Rfl: 1   hyoscyamine (ANASPAZ) 0.125 MG TBDP disintergrating tablet, Place 0.125 mg under the tongue every 4 (four) hours as needed., Disp: , Rfl:    mesalamine (APRISO) 0.375 g 24 hr capsule, Take 4 capsules (1.5 g total) by mouth daily., Disp: 360 capsule, Rfl: 3   Multiple Vitamin  (MULTIVITAMIN) tablet, Take 1 tablet by mouth daily., Disp: , Rfl:    rosuvastatin (CRESTOR) 20 MG tablet, Take 1 tablet (20 mg total) by mouth 2 (two) times a week., Disp: 12 tablet, Rfl: 3  Social History   Tobacco Use  Smoking Status Never  Smokeless Tobacco Never    Allergies  Allergen Reactions   Penicillin G Other (See Comments)    As a kid had a reaction    Mango Flavor    Objective:   There were no vitals filed for this visit.  There is no height or weight on file to calculate BMI. Constitutional Well developed. Well nourished.  Vascular Dorsalis pedis pulses palpable bilaterally. Posterior tibial pulses palpable bilaterally. Capillary refill normal to all digits.  No cyanosis or clubbing noted. Pedal hair growth normal.  Neurologic Normal speech. Oriented to person, place, and time. Epicritic sensation to light touch grossly present bilaterally.  Dermatologic Nails well groomed and normal in appearance. No open wounds. No skin lesions.  Orthopedic: Normal joint ROM without pain or crepitus bilaterally. No visible deformities. Tender to palpation at the calcaneal tuber right. No pain with calcaneal squeeze right. Ankle ROM diminished range of motion right. Silfverskiold Test: positive right.   Radiographs: Taken and reviewed. No acute fractures or dislocations. No evidence  of stress fracture.  Plantar heel spur absent. Posterior heel spur absent.  Pes cavus foot structure  Assessment:   1. Plantar fasciitis of right foot   2. Gastrocnemius equinus, right     Plan:  Patient was evaluated and treated and all questions answered.  Plantar Fasciitis, right with underlying gastrocnemius equinus - XR reviewed as above.  - Educated on icing and stretching. Instructions given.  - second Injection delivered to the plantar fascia as below. - DME: Plantar fascial brace dispensed to support the medial longitudinal arch of the foot and offload pressure from the heel  and prevent arch collapse during weightbearing - Pharmacologic management: None -I discussed power steps insoles.  Procedure: Injection Tendon/Ligament Location: Right plantar fascia at the glabrous junction; medial approach. Skin Prep: alcohol Injectate: 0.5 cc 0.5% marcaine plain, 0.5 cc of 1% Lidocaine, 0.5 cc kenalog 10. Disposition: Patient tolerated procedure well. Injection site dressed with a band-aid.  No follow-ups on file.

## 2023-11-20 ENCOUNTER — Encounter: Payer: Self-pay | Admitting: Nurse Practitioner

## 2023-12-25 DIAGNOSIS — L603 Nail dystrophy: Secondary | ICD-10-CM | POA: Diagnosis not present

## 2023-12-25 DIAGNOSIS — L821 Other seborrheic keratosis: Secondary | ICD-10-CM | POA: Diagnosis not present

## 2023-12-25 DIAGNOSIS — D225 Melanocytic nevi of trunk: Secondary | ICD-10-CM | POA: Diagnosis not present

## 2023-12-25 DIAGNOSIS — L814 Other melanin hyperpigmentation: Secondary | ICD-10-CM | POA: Diagnosis not present

## 2023-12-25 DIAGNOSIS — L718 Other rosacea: Secondary | ICD-10-CM | POA: Diagnosis not present

## 2024-01-22 ENCOUNTER — Encounter: Payer: Self-pay | Admitting: Nurse Practitioner

## 2024-01-22 ENCOUNTER — Ambulatory Visit: Payer: Medicare Other | Admitting: Nurse Practitioner

## 2024-01-22 VITALS — BP 132/78 | HR 80 | Temp 97.9°F | Ht 64.0 in | Wt 143.4 lb

## 2024-01-22 DIAGNOSIS — E7800 Pure hypercholesterolemia, unspecified: Secondary | ICD-10-CM

## 2024-01-22 DIAGNOSIS — I700 Atherosclerosis of aorta: Secondary | ICD-10-CM | POA: Diagnosis not present

## 2024-01-22 LAB — LIPID PANEL
Cholesterol: 155 mg/dL (ref 0–200)
HDL: 67.1 mg/dL (ref >39.00)
LDL Cholesterol: 69 mg/dL (ref 0–99)
NonHDL: 87.59
Total CHOL/HDL Ratio: 2
Triglycerides: 91 mg/dL (ref 0.0–149.0)
VLDL: 18.2 mg/dL (ref 0.0–40.0)

## 2024-01-22 LAB — COMPREHENSIVE METABOLIC PANEL
ALT: 16 U/L (ref 0–35)
AST: 15 U/L (ref 0–37)
Albumin: 4.1 g/dL (ref 3.5–5.2)
Alkaline Phosphatase: 43 U/L (ref 39–117)
BUN: 10 mg/dL (ref 6–23)
CO2: 30 meq/L (ref 19–32)
Calcium: 9.5 mg/dL (ref 8.4–10.5)
Chloride: 102 meq/L (ref 96–112)
Creatinine, Ser: 0.6 mg/dL (ref 0.40–1.20)
GFR: 91.07 mL/min (ref >60.00)
Glucose, Bld: 90 mg/dL (ref 70–99)
Potassium: 4 meq/L (ref 3.5–5.1)
Sodium: 139 meq/L (ref 135–145)
Total Bilirubin: 1.1 mg/dL (ref 0.2–1.2)
Total Protein: 6.6 g/dL (ref 6.0–8.3)

## 2024-01-22 NOTE — Progress Notes (Unsigned)
 Established Patient Visit  Patient: Crystal Blair   DOB: [birth year]   73 y.o. Female  MRN: [id number] Visit Date: 01/23/2024  Subjective:    Chief Complaint  Patient presents with   Follow-up    6 month f/u wants to discuss Rosuvastatin    HPI Pure hypercholesterolemia Repeat lipid panel: LDL at goal Maintain crestor dose  Aortic atherosclerosis (HCC) Per CT 12/2022 LDL at goal with crestor 2x/week BP at goal No DIABETES No tobacco use  Reviewed medical, surgical, and social history today  Medications: Outpatient Medications Prior to Visit  Medication Sig   azelastine (ASTELIN) 0.1 % nasal spray Place 1 spray into both nostrils 2 (two) times daily. Use in each nostril as directed   Bioflavonoid Products (VITAMIN C) CHEW Chew 1 tablet by mouth daily in the afternoon.   dicyclomine (BENTYL) 20 MG tablet Take 1 tablet (20 mg total) by mouth 3 (three) times daily as needed for spasms.   fluticasone (FLONASE) 50 MCG/ACT nasal spray Place 2 sprays into both nostrils daily. (Patient taking differently: Place 2 sprays into both nostrils daily as needed.)   hyoscyamine (ANASPAZ) 0.125 MG TBDP disintergrating tablet Place 0.125 mg under the tongue every 4 (four) hours as needed.   mesalamine (APRISO) 0.375 g 24 hr capsule Take 4 capsules (1.5 g total) by mouth daily.   Multiple Vitamin (MULTIVITAMIN) tablet Take 1 tablet by mouth daily.   rosuvastatin (CRESTOR) 20 MG tablet Take 1 tablet (20 mg total) by mouth 2 (two) times a week.   No facility-administered medications prior to visit.   Reviewed past medical and social history.   ROS per HPI above      Objective:  BP 132/78 (BP Location: Left Arm, Patient Position: Sitting, Cuff Size: Normal)   Pulse 80   Temp 97.9 F (36.6 C) (Temporal)   Ht 5\' 4"  (1.626 m)   Wt 143 lb 6.4 oz (65 kg)   SpO2 98%   BMI 24.61 kg/m      Physical Exam Vitals and nursing note reviewed.  Cardiovascular:     Rate and  Rhythm: Normal rate and regular rhythm.     Pulses: Normal pulses.     Heart sounds: Normal heart sounds.  Pulmonary:     Effort: Pulmonary effort is normal.     Breath sounds: Normal breath sounds.  Musculoskeletal:     Right lower leg: No edema.     Left lower leg: No edema.  Neurological:     Mental Status: She is alert and oriented to person, place, and time.     Results for orders placed or performed in visit on 01/22/24  Lipid panel  Result Value Ref Range   Cholesterol 155 0 - 200 mg/dL   Triglycerides 32.4 0.0 - 149.0 mg/dL   HDL 40.10 >27.25 mg/dL   VLDL 36.6 0.0 - 44.0 mg/dL   LDL Cholesterol 69 0 - 99 mg/dL   Total CHOL/HDL Ratio 2    NonHDL 87.59   Comprehensive metabolic panel  Result Value Ref Range   Sodium 139 135 - 145 mEq/L   Potassium 4.0 3.5 - 5.1 mEq/L   Chloride 102 96 - 112 mEq/L   CO2 30 19 - 32 mEq/L   Glucose, Bld 90 70 - 99 mg/dL   BUN 10 6 - 23 mg/dL   Creatinine, Ser 3.47 0.40 - 1.20 mg/dL   Total Bilirubin 1.1  0.2 - 1.2 mg/dL   Alkaline Phosphatase 43 39 - 117 U/L   AST 15 0 - 37 U/L   ALT 16 0 - 35 U/L   Total Protein 6.6 6.0 - 8.3 g/dL   Albumin 4.1 3.5 - 5.2 g/dL   GFR 16.10 >96.04 mL/min   Calcium 9.5 8.4 - 10.5 mg/dL      Assessment & Plan:    Problem List Items Addressed This Visit     Aortic atherosclerosis (HCC) - Primary   Per CT 12/2022 LDL at goal with crestor 2x/week BP at goal No DIABETES No tobacco use      Relevant Orders   Lipid panel (Completed)   Comprehensive metabolic panel (Completed)   Pure hypercholesterolemia   Repeat lipid panel: LDL at goal Maintain crestor dose      Relevant Orders   Lipid panel (Completed)   Comprehensive metabolic panel (Completed)   Return in about 6 months (around 07/24/2024) for CPE (fasting).     Alysia Penna, NP

## 2024-01-22 NOTE — Patient Instructions (Signed)
 Go to lab Maintain Heart healthy diet and daily exercise. Maintain current medications. Schedule appointment for mammogram and bone density

## 2024-01-23 NOTE — Assessment & Plan Note (Signed)
 Per CT 12/2022 LDL at goal with crestor 2x/week BP at goal No DIABETES No tobacco use

## 2024-01-23 NOTE — Assessment & Plan Note (Signed)
Repeat lipid panel: LDL at goal Maintain crestor dose 

## 2024-01-31 ENCOUNTER — Other Ambulatory Visit: Payer: Self-pay | Admitting: Nurse Practitioner

## 2024-01-31 DIAGNOSIS — Z1231 Encounter for screening mammogram for malignant neoplasm of breast: Secondary | ICD-10-CM

## 2024-02-12 ENCOUNTER — Ambulatory Visit
Admission: RE | Admit: 2024-02-12 | Discharge: 2024-02-12 | Disposition: A | Discharge status: Home / Self Care | Source: Ambulatory Visit | Attending: Nurse Practitioner

## 2024-02-12 DIAGNOSIS — Z1231 Encounter for screening mammogram for malignant neoplasm of breast: Secondary | ICD-10-CM

## 2024-03-26 ENCOUNTER — Telehealth: Payer: Self-pay | Admitting: Gastroenterology

## 2024-03-26 DIAGNOSIS — K649 Unspecified hemorrhoids: Secondary | ICD-10-CM

## 2024-03-26 DIAGNOSIS — K50119 Crohn's disease of large intestine with unspecified complications: Secondary | ICD-10-CM

## 2024-03-26 DIAGNOSIS — R109 Unspecified abdominal pain: Secondary | ICD-10-CM

## 2024-03-26 NOTE — Telephone Encounter (Signed)
 Patient called requesting a prescription refill for Apriso  and requested it to be done in a 97-month supply as she will be traveling a lot during the summer and wants to make sure she has enough with her.  She also has a scheduled appointment for her yearly follow-up/med refills on 04/17/24 at 8:20 a.m. with Santina Cull.  Thank you.

## 2024-03-27 MED ORDER — MESALAMINE ER 0.375 G PO CP24: 1.5000 g | ORAL_CAPSULE | Freq: Every day | ORAL | 3 refills | Status: DC

## 2024-03-27 NOTE — Telephone Encounter (Signed)
 Rx for Apriso  sent to pharmacy as requested by the patient.

## 2024-04-16 NOTE — Progress Notes (Unsigned)
 04/17/2024 [name] [id number] 1951/07/28  Referring provider: Kandace Organ, NP Primary GI doctor: Dr. Karene Oto  ASSESSMENT AND PLAN:   History of SCAD with superimposed diverticulitis 12/2022 uncomplicated diverticulitis good response to Bactrim  Flagyl  On 4 tablets Apriso  0.375 daily, rare missing Possible flare related to constipation and mucus for 7 days, then yesterday acutely after eating a bagel patient had nausea, vomiting, RUQ AB pain, large volume stools and increase gas, had some subjective chills with BM only. Took levsin and this helped.  Suspicious for diverticulitis/colitis with history and physical exam, patient hemodynamically stable - continue Apriso  4 tablets daily - get CBC, CMET, and sed rate. -Will schedule for CT AB and pelvis with contrast to evaluate further -IBGARD daily, will give Bentyl  as needed, heating pad and liquid diet. - Prescribed Bactrim  and Flagyl  - ER precautions discussed with the patient -close follow up 6-8 weeks, will plan on scheduling colonoscopy sooner at that time  History of colonic polyps Colonoscopy (08/06/2020, Dr. Karene Oto): 2 sessile serrated polyps in transverse and ascending colon, nonbleeding cecal AVM, localized moderate gastritis in sigmoid colon 20-30 cm from anal verge (path: Diverticular Associated Colitis), sigmoid and ascending diverticulosis, grade 2 hemorrhoids.  Normal TI. RECALL 07/2025, consider sooner if symptoms do no resolve   Patient Care Team: Nche, Connye Delaine, NP as PCP - General (Internal Medicine) Devon Fogo, MD (Inactive) as Consulting Physician (Dermatology)  HISTORY OF PRESENT ILLNESS: 73 y.o. female with a past medical history of SCAD, colon polyps and others listed below presents for evaluation of AB pain.   03/23/2022 office visit Dr. Karene Oto for segmental colitis with complication, had hemorrhoidal banding 01/26/2021, previously well-controlled scad on Apriso , refilled  that visit. Had some pain at that time, given levsin. This worked short time about 2-3 hours and did not help.   Endoscopic history: -Multiple colonoscopies in [place] prior to moving to [place].  Started colonoscopies approx age 63, notable for polyps at that time, and has subsequently had frequent surveillance colonoscopy, with a couple of polyps since then.  Also hx of internal hemorrhoids and diverticulosis. -Last colonoscopy in [place] approximate 2016 and notable for tubular adenoma with recommendation repeat in 5 years. -Colonoscopy (08/06/2020, Dr. Karene Oto): 2 sessile serrated polyps in transverse and ascending colon, nonbleeding cecal AVM, localized moderate gastritis in sigmoid colon 20-30 cm from anal verge (path: Diverticular Associated Colitis), sigmoid and ascending diverticulosis, grade 2 hemorrhoids.  Normal TI.  Repeat 5 years RECALL 07/2025   01/02/2023: CT A/P: Pandiverticulosis with circumferential wall thickening and mild surrounding inflammation in distal descending colon/proximal sigmoid colon consistent with acute, uncomplicated diverticulitis.  No abscess or perforation.  Was started on Bactrim /Flagyl  (PCN allergy)   Discussed the use of AI scribe software for clinical note transcription with the patient, who gave verbal consent to proceed.  History of Present Illness   Crystal Blair is a 73 year old female with diverticulitis who presents with increased mucus in stool and gastrointestinal discomfort.  She has experienced white mucus in her stool several times over the past week, with occasional bloody mucus. The mucus is not present with every bowel movement. She has also experienced intermittent constipation, which she attributes to not drinking enough water and not maintaining her usual diet, which typically includes regular salads.  Yesterday, after consuming a sandwich, she experienced severe gastrointestinal symptoms within fifteen minutes, including abdominal pain and a large,  sudden bowel movement described as 'twenty pounds of fluffy poop.' This was accompanied  by significant sinus mucus drainage, nausea, and vomiting, which included food from the previous night. She also experienced a sensation of fullness upon waking, followed by intense hunger.  She recalls a previous episode of diverticulitis treated with Bactrim  and Flagyl  over a year ago. She has been taking Apriso , four capsules daily, but missed a dose three weeks ago. She manages her symptoms with hyoscyamine , which she finds effective for acute episodes, and occasionally uses dicyclomine .  She describes experiencing right upper quadrant discomfort and significant gas, which provided relief after passing. No fever or chills, but she felt hot and then cold during a bowel movement. She has not observed any illness in those around her.  Her social history includes a recent trip to [place], which was emotionally challenging due to the loss of her son. She acknowledges not drinking enough water and consuming more coffee than usual.       She  reports that she has never smoked. She has never used smokeless tobacco. She reports current alcohol use of about 1.0 standard drink of alcohol per week. She reports that she does not use drugs.  Current Medications:    Current Outpatient Medications (Cardiovascular):    rosuvastatin  (CRESTOR ) 20 MG tablet, Take 1 tablet (20 mg total) by mouth 2 (two) times a week.  Current Outpatient Medications (Respiratory):    azelastine  (ASTELIN ) 0.1 % nasal spray, Place 1 spray into both nostrils 2 (two) times daily. Use in each nostril as directed   fluticasone  (FLONASE ) 50 MCG/ACT nasal spray, Place 2 sprays into both nostrils daily. (Patient taking differently: Place 2 sprays into both nostrils daily as needed.)    Current Outpatient Medications (Other):    Bioflavonoid Products (VITAMIN C) CHEW, Chew 1 tablet by mouth daily in the afternoon.   mesalamine  (APRISO ) 0.375  g 24 hr capsule, Take 4 capsules (1.5 g total) by mouth daily.   metroNIDAZOLE  (FLAGYL ) 500 MG tablet, Take 1 tablet (500 mg total) by mouth 3 (three) times daily.   Multiple Vitamin (MULTIVITAMIN) tablet, Take 1 tablet by mouth daily.   sulfamethoxazole -trimethoprim  (BACTRIM  DS) 800-160 MG tablet, Take 1 tablet by mouth 2 (two) times daily for 14 days.   dicyclomine  (BENTYL ) 20 MG tablet, Take 1 tablet (20 mg total) by mouth 3 (three) times daily as needed for spasms.   hyoscyamine  (ANASPAZ ) 0.125 MG TBDP disintergrating tablet, Place 1 tablet (0.125 mg total) under the tongue every 4 (four) hours as needed for cramping.  Medical History:  Past Medical History:  Diagnosis Date   Arthritis    Diverticulosis    History of chicken pox    History of gallstones    Tubular adenoma 2015   Per Colonoscopy/q21yrs   Ulcerative colitis (HCC)    Allergies:  Allergies  Allergen Reactions   Penicillin G Other (See Comments)    As a kid had a reaction    Mango Flavoring Agent (Non-Screening)      Surgical History:  She  has a past surgical history that includes Cholecystectomy (1991); Breast biopsy (Left, 2015); Breast excisional biopsy (Left); Retinal tear repair cryotherapy (Right, 05/18/2020); Colonoscopy; Esophagogastroduodenoscopy; and Replacement total knee (03/02/2021). Family History:  Her family history includes Alcohol abuse in her father; Arthritis in her mother; Bladder Cancer in her sister; COPD in her mother; Colon cancer in her maternal aunt; Crohn's disease in her son; Depression in her mother; Early death (age of onset: 26) in her father; Hyperlipidemia in her father; Hypertension in her father and  sister.  REVIEW OF SYSTEMS  : All other systems reviewed and negative except where noted in the History of Present Illness.  PHYSICAL EXAM: BP 118/66 (BP Location: Left Arm, Patient Position: Sitting, Cuff Size: Normal)   Pulse 73  Physical Exam   GENERAL APPEARANCE: Well nourished,  in no apparent distress. HEENT: No cervical lymphadenopathy, unremarkable thyroid , sclerae anicteric, conjunctiva pink. RESPIRATORY: Respiratory effort normal, breath sounds equal bilaterally without rales, rhonchi, or wheezing. CARDIO: Regular rate and rhythm with no murmurs, rubs, or gallops, peripheral pulses intact. ABDOMEN: Soft, non-distended, active bowel sounds in all four quadrants, no tenderness to palpation, no rebound tenderness, left lower abdomen discomfort on palpation, no mass appreciated. RECTAL: Declines. MUSCULOSKELETAL: Full range of motion, normal gait, without edema. SKIN: Dry, intact without rashes or lesions. No jaundice. NEURO: Alert, oriented, no focal deficits. PSYCH: Cooperative, normal mood and affect.       RELEVANT LABS AND IMAGING: CBC    Component Value Date/Time   WBC 6.4 12/20/2022 0925   RBC 4.47 12/20/2022 0925   HGB 13.3 12/20/2022 0925   HCT 39.4 12/20/2022 0925   PLT 360.0 12/20/2022 0925   MCV 88.1 12/20/2022 0925   MCHC 33.8 12/20/2022 0925   RDW 13.7 12/20/2022 0925   LYMPHSABS 1.4 12/20/2022 0925   MONOABS 0.4 12/20/2022 0925   EOSABS 0.1 12/20/2022 0925   BASOSABS 0.0 12/20/2022 0925    CMP     Component Value Date/Time   NA 139 01/22/2024 0937   NA 143 03/12/2015 0000   K 4.0 01/22/2024 0937   CL 102 01/22/2024 0937   CO2 30 01/22/2024 0937   GLUCOSE 90 01/22/2024 0937   BUN 10 01/22/2024 0937   BUN 13 03/12/2015 0000   CREATININE 0.60 01/22/2024 0937   CALCIUM  9.5 01/22/2024 0937   PROT 6.6 01/22/2024 0937   ALBUMIN 4.1 01/22/2024 0937   AST 15 01/22/2024 0937   ALT 16 01/22/2024 0937   ALKPHOS 43 01/22/2024 0937   BILITOT 1.1 01/22/2024 0937     Edmonia Gottron, PA-C 9:20 AM

## 2024-04-17 ENCOUNTER — Other Ambulatory Visit: Payer: Self-pay | Admitting: Nurse Practitioner

## 2024-04-17 ENCOUNTER — Other Ambulatory Visit: Payer: Self-pay | Admitting: Physician Assistant

## 2024-04-17 ENCOUNTER — Ambulatory Visit: Payer: Self-pay | Admitting: Physician Assistant

## 2024-04-17 ENCOUNTER — Encounter: Payer: Self-pay | Admitting: Physician Assistant

## 2024-04-17 ENCOUNTER — Ambulatory Visit: Admitting: Physician Assistant

## 2024-04-17 ENCOUNTER — Ambulatory Visit (HOSPITAL_COMMUNITY)
Admission: RE | Admit: 2024-04-17 | Discharge: 2024-04-17 | Disposition: A | Discharge status: Home / Self Care | Source: Ambulatory Visit | Attending: Physician Assistant | Admitting: Physician Assistant

## 2024-04-17 ENCOUNTER — Other Ambulatory Visit (INDEPENDENT_AMBULATORY_CARE_PROVIDER_SITE_OTHER)

## 2024-04-17 VITALS — BP 118/66 | HR 73

## 2024-04-17 DIAGNOSIS — R109 Unspecified abdominal pain: Secondary | ICD-10-CM

## 2024-04-17 DIAGNOSIS — R1011 Right upper quadrant pain: Secondary | ICD-10-CM | POA: Diagnosis not present

## 2024-04-17 DIAGNOSIS — Z9049 Acquired absence of other specified parts of digestive tract: Secondary | ICD-10-CM | POA: Diagnosis not present

## 2024-04-17 DIAGNOSIS — K5730 Diverticulosis of large intestine without perforation or abscess without bleeding: Secondary | ICD-10-CM | POA: Diagnosis not present

## 2024-04-17 DIAGNOSIS — R748 Abnormal levels of other serum enzymes: Secondary | ICD-10-CM

## 2024-04-17 DIAGNOSIS — K50119 Crohn's disease of large intestine with unspecified complications: Secondary | ICD-10-CM | POA: Diagnosis not present

## 2024-04-17 DIAGNOSIS — K5732 Diverticulitis of large intestine without perforation or abscess without bleeding: Secondary | ICD-10-CM

## 2024-04-17 DIAGNOSIS — R112 Nausea with vomiting, unspecified: Secondary | ICD-10-CM

## 2024-04-17 DIAGNOSIS — M791 Myalgia, unspecified site: Secondary | ICD-10-CM

## 2024-04-17 DIAGNOSIS — Z860100 Personal history of colon polyps, unspecified: Secondary | ICD-10-CM

## 2024-04-17 DIAGNOSIS — R195 Other fecal abnormalities: Secondary | ICD-10-CM

## 2024-04-17 DIAGNOSIS — Z860101 Personal history of adenomatous and serrated colon polyps: Secondary | ICD-10-CM

## 2024-04-17 DIAGNOSIS — E7800 Pure hypercholesterolemia, unspecified: Secondary | ICD-10-CM

## 2024-04-17 DIAGNOSIS — K649 Unspecified hemorrhoids: Secondary | ICD-10-CM

## 2024-04-17 DIAGNOSIS — N281 Cyst of kidney, acquired: Secondary | ICD-10-CM | POA: Diagnosis not present

## 2024-04-17 LAB — SEDIMENTATION RATE: Sed Rate: 6 mm/h (ref 0–30)

## 2024-04-17 LAB — COMPREHENSIVE METABOLIC PANEL WITH GFR
ALT: 12 U/L (ref 0–35)
AST: 13 U/L (ref 0–37)
Albumin: 4 g/dL (ref 3.5–5.2)
Alkaline Phosphatase: 45 U/L (ref 39–117)
BUN: 12 mg/dL (ref 6–23)
CO2: 30 meq/L (ref 19–32)
Calcium: 9.1 mg/dL (ref 8.4–10.5)
Chloride: 104 meq/L (ref 96–112)
Creatinine, Ser: 0.54 mg/dL (ref 0.40–1.20)
GFR: 93.26 mL/min (ref >60.00)
Glucose, Bld: 100 mg/dL — ABNORMAL HIGH (ref 70–99)
Potassium: 3.9 meq/L (ref 3.5–5.1)
Sodium: 141 meq/L (ref 135–145)
Total Bilirubin: 0.8 mg/dL (ref 0.2–1.2)
Total Protein: 6.6 g/dL (ref 6.0–8.3)

## 2024-04-17 LAB — CBC WITH DIFFERENTIAL/PLATELET
Basophils Absolute: 0 10*3/uL (ref 0.0–0.1)
Basophils Relative: 0.8 % (ref 0.0–3.0)
Eosinophils Absolute: 0.1 10*3/uL (ref 0.0–0.7)
Eosinophils Relative: 2.2 % (ref 0.0–5.0)
HCT: 39.1 % (ref 36.0–46.0)
Hemoglobin: 13.2 g/dL (ref 12.0–15.0)
Lymphocytes Relative: 31.1 % (ref 12.0–46.0)
Lymphs Abs: 1.8 10*3/uL (ref 0.7–4.0)
MCHC: 33.8 g/dL (ref 30.0–36.0)
MCV: 93.1 fl (ref 78.0–100.0)
Monocytes Absolute: 0.4 10*3/uL (ref 0.1–1.0)
Monocytes Relative: 6.8 % (ref 3.0–12.0)
Neutro Abs: 3.5 10*3/uL (ref 1.4–7.7)
Neutrophils Relative %: 59.1 % (ref 43.0–77.0)
Platelets: 234 10*3/uL (ref 150.0–400.0)
RBC: 4.21 Mil/uL (ref 3.87–5.11)
RDW: 12.9 % (ref 11.5–15.5)
WBC: 5.9 10*3/uL (ref 4.0–10.5)

## 2024-04-17 LAB — CK: Total CK: 400 U/L — ABNORMAL HIGH (ref 7–177)

## 2024-04-17 MED ORDER — IOHEXOL 300 MG/ML  SOLN
100.0000 mL | Freq: Once | INTRAMUSCULAR | Status: AC | PRN
  Administered 2024-04-17: 100 mL via INTRAVENOUS

## 2024-04-17 MED ORDER — METRONIDAZOLE 500 MG PO TABS: 500.0000 mg | ORAL_TABLET | Freq: Three times a day (TID) | ORAL | 0 refills | Status: DC

## 2024-04-17 MED ORDER — HYOSCYAMINE SULFATE 0.125 MG PO TBDP: 0.1250 mg | ORAL_TABLET | ORAL | 2 refills | Status: AC | PRN

## 2024-04-17 MED ORDER — DICYCLOMINE HCL 20 MG PO TABS: 20.0000 mg | ORAL_TABLET | Freq: Three times a day (TID) | ORAL | 2 refills | Status: AC | PRN

## 2024-04-17 MED ORDER — IOHEXOL 9 MG/ML PO SOLN
ORAL | Status: AC
  Filled 2024-04-17: qty 1000

## 2024-04-17 MED ORDER — SULFAMETHOXAZOLE-TRIMETHOPRIM 800-160 MG PO TABS: 1.0000 | ORAL_TABLET | Freq: Two times a day (BID) | ORAL | 0 refills | Status: AC

## 2024-04-17 MED ORDER — IOHEXOL 9 MG/ML PO SOLN
1000.0000 mL | ORAL | Status: AC
  Administered 2024-04-17: 1000 mL via ORAL

## 2024-04-17 NOTE — Patient Instructions (Addendum)
 Your provider has requested that you go to the basement level for lab work before leaving today. Press "B" on the elevator. The lab is located at the first door on the left as you exit the elevator.  You have been scheduled for a CT scan of the abdomen and pelvis at [facility], 1st floor Radiology. You are scheduled on 04/17/24 at 2 pm. You should arrive 15 minutes prior to your appointment time for registration.    Please follow the written instructions below on the day of your exam:   1) Do not eat anything after 10 am (4 hours prior to your test).     You may take any medications as prescribed with a small amount of water, if necessary. If you take any of the following medications: METFORMIN, GLUCOPHAGE, GLUCOVANCE, AVANDAMET, RIOMET, FORTAMET, ACTOPLUS MET, JANUMET, GLUMETZA or METAGLIP, you MAY be asked to HOLD this medication 48 hours AFTER the exam.   The purpose of you drinking the oral contrast is to aid in the visualization of your intestinal tract. The contrast solution may cause some diarrhea. Depending on your individual set of symptoms, you may also receive an intravenous injection of x-ray contrast/dye. Plan on being at [facility] for 45 minutes or longer, depending on the type of exam you are having performed.   If you have any questions regarding your exam or if you need to reschedule, you may call [facility] at [phone] between the hours of 8:00 am and 5:00 pm, Monday-Friday.     Talk with PCP about cardiac calcium  score  Will give Bactrim  and Flagyl , take if pain is worse or CT shows diverticulitis  Set up CT AB and pelvis with contrast Can take dicyclomine  at least 1-2 x a day for pain if needed.   Can do heating pad and can take tylenol max of 3000mg  a day.  Can add on lidocaine patches or voltern gel Go to the ER if unable to pass gas, severe AB pain, unable to hold down food, any shortness of breath of chest  pain.   Diverticulitis Diverticulitis is inflammation or infection of small pouches in your colon that form when you have a condition called diverticulosis. The pouches in your colon are called diverticula. Your colon, or large intestine, is where water is absorbed and stool is formed. Complications of diverticulitis can include: Bleeding. Severe infection. Severe pain. Perforation of your colon. Obstruction of your colon.  What are the causes? Diverticulitis is caused by bacteria. Diverticulitis happens when stool becomes trapped in diverticula. This allows bacteria to grow in the diverticula, which can lead to inflammation and infection. What increases the risk? People with diverticulosis are at risk for diverticulitis. Eating a diet that does not include enough fiber from fruits and vegetables may make diverticulitis more likely to develop. What are the signs or symptoms? Symptoms of diverticulitis may include: Abdominal pain and tenderness. The pain is normally located on the left side of the abdomen, but may occur in other areas. Fever and chills. Bloating. Cramping. Nausea. Vomiting. Constipation. Diarrhea. Blood in your stool.  How is this diagnosed? Your health care provider will ask you about your medical history and do a physical exam. You may need to have tests done because many medical conditions can cause the same symptoms as diverticulitis. Tests may include: Blood tests. Urine tests. Imaging tests of the abdomen, including X-rays and CT scans.  When your condition is under control, your health care provider may  recommend that you have a colonoscopy. A colonoscopy can show how severe your diverticula are and whether something else is causing your symptoms. How is this treated? Most cases of diverticulitis are mild and can be treated at home. Treatment may include: Taking over-the-counter pain medicines. Following a clear liquid diet. Taking antibiotic medicines  by mouth for 7-10 days.  More severe cases may be treated at a hospital. Treatment may include: Not eating or drinking. Taking prescription pain medicine. Receiving antibiotic medicines through an IV tube. Receiving fluids and nutrition through an IV tube. Surgery.  Follow these instructions at home: Follow your health care provider's instructions carefully. Follow a full liquid diet or other diet as directed by your health care provider. After your symptoms improve, your health care provider may tell you to change your diet. He or she may recommend you eat a high-fiber diet. Fruits and vegetables are good sources of fiber. Fiber makes it easier to pass stool. Take fiber supplements or probiotics as directed by your health care provider. Only take medicines as directed by your health care provider. Keep all your follow-up appointments. Contact a health care provider if: Your pain does not improve. You have a hard time eating food. Your bowel movements do not return to normal. Get help right away if: Your pain becomes worse. Your symptoms do not get better. Your symptoms suddenly get worse. You have a fever. You have repeated vomiting. You have bloody or black, tarry stools. This information is not intended to replace advice given to you by your health care provider. Make sure you discuss any questions you have with your health care provider. Document Released: 08/17/2005 Document Revised: 04/14/2016 Document Reviewed: 10/02/2013 Elsevier Interactive Patient Education  2017 [employer].    During an Acute Flare-Up (Clear Liquid to Low-Fiber Diet) The goal is to reduce irritation and let your colon rest.  Day 1-3: Clear Liquid Diet Water  Broth (chicken, beef, or vegetable)  Clear juices (apple, white grape - avoid citrus)  Ice pops without pulp or seeds  Gelatin (no fruit or seeds)  Tea or coffee (no cream or dairy)  After Symptoms Improve: Low-Fiber Diet Gradually  transition to low-fiber foods for easier digestion.  Sample Foods White rice, pasta, or plain white bread  Cooked or canned vegetables without skins or seeds (e.g., carrots, green beans, potatoes)  Eggs, fish, or poultry  Low-fiber cereals (like cornflakes)  Dairy (if tolerated)  Ripe bananas, melon, or canned fruit without seeds  Long-Term Maintenance: High-Fiber Diet (Once Fully Recovered) This helps prevent future flare-ups by keeping the bowel movements soft and regular.  High-Fiber Foods Fruits: Apples (peeled), pears, berries, prunes  Vegetables: Broccoli, spinach, zucchini, peas  Whole grains: Oatmeal, brown rice, quinoa, whole wheat bread  Legumes: Lentils, chickpeas, black beans (start slowly to avoid gas)  Nuts & seeds: Only if tolerated (research no longer restricts them, but if you feel they cause a flare do not eat them)

## 2024-04-17 NOTE — Assessment & Plan Note (Signed)
 D/c crestor  due to persistent elevated CK. Repeat lab in 37month

## 2024-04-25 NOTE — Telephone Encounter (Signed)
-----   Message from [facility] A sent at 04/25/2024  9:31 AM EDT ----- Patient called and informed to stop Crestor . She asked do you advise anything to take in it's place

## 2024-05-27 ENCOUNTER — Telehealth: Payer: Self-pay | Admitting: Physician Assistant

## 2024-05-27 DIAGNOSIS — R109 Unspecified abdominal pain: Secondary | ICD-10-CM

## 2024-05-27 DIAGNOSIS — K5732 Diverticulitis of large intestine without perforation or abscess without bleeding: Secondary | ICD-10-CM

## 2024-05-27 DIAGNOSIS — K50119 Crohn's disease of large intestine with unspecified complications: Secondary | ICD-10-CM

## 2024-05-27 DIAGNOSIS — M791 Myalgia, unspecified site: Secondary | ICD-10-CM

## 2024-05-27 NOTE — Telephone Encounter (Signed)
 Orders in epic.

## 2024-05-27 NOTE — Progress Notes (Unsigned)
 05/29/2024 Crystal Blair [id number] Oct 31, 1951  Referring provider: Katheen Roselie Rockford, NP Primary GI doctor: Dr. San  ASSESSMENT AND PLAN:   History of SCAD with IBS 12/2022 uncomplicated diverticulitis good response to Bactrim  Flagyl  On 4 tablets Apriso  0.375 daily, rare missing 04/17/2024 given Bactrim  Flagyl  but did not take with normal CT 04/17/2024 CT and pelvis with contrast showed diverticulosis without diverticulitis Bowel habits regular, very rare mucus/blood, small volume - continue Apriso  4 tablets daily -IBGARD daily, continue bentyl  as needed - avoid milk products  -given FODMAP diet -consider SIBO testing - Discussed possibility of earlier colonoscopy with the patient, she states she does not want to schedule at this time and she is doing well overall. Will do 6 month follow up and discuss with Dr. Jerie if he would like the colonoscopy sooner or not.  - will call if any change in symptoms  History of colonic polyps Colonoscopy (08/06/2020, Dr. San): 2 sessile serrated polyps in transverse and ascending colon, nonbleeding cecal AVM, localized moderate gastritis in sigmoid colon 20-30 cm from anal verge (path: Diverticular Associated Colitis), sigmoid and ascending diverticulosis, grade 2 hemorrhoids.  Normal TI. RECALL 07/2025, consider sooner if symptoms do no resolve   Patient Care Team: Nche, Roselie Rockford, NP as PCP - General (Internal Medicine) Livingston Rigg, MD (Inactive) as Consulting Physician (Dermatology)  HISTORY OF PRESENT ILLNESS: 73 y.o. female with a past medical history of SCAD, colon polyps and others listed below presents for evaluation of AB pain.   03/23/2022 office visit Dr. San for segmental colitis with complication, had hemorrhoidal banding 01/26/2021, previously well-controlled scad on Apriso , refilled that visit. Had some pain at that time, given levsin . This worked short time about 2-3 hours and did not help.    Endoscopic history: -Multiple colonoscopies in [place] prior to moving to [place].  Started colonoscopies approx age 75, notable for polyps at that time, and has subsequently had frequent surveillance colonoscopy, with a couple of polyps since then.  Also hx of internal hemorrhoids and diverticulosis. -Last colonoscopy in [place] approximate 2016 and notable for tubular adenoma with recommendation repeat in 5 years. -Colonoscopy (08/06/2020, Dr. San): 2 sessile serrated polyps in transverse and ascending colon, nonbleeding cecal AVM, localized moderate gastritis in sigmoid colon 20-30 cm from anal verge (path: Diverticular Associated Colitis), sigmoid and ascending diverticulosis, grade 2 hemorrhoids.  Normal TI.  Repeat 5 years RECALL 07/2025   01/02/2023: CT A/P: Pandiverticulosis with circumferential wall thickening and mild surrounding inflammation in distal descending colon/proximal sigmoid colon consistent with acute, uncomplicated diverticulitis.  No abscess or perforation.  Was started on Bactrim /Flagyl  (PCN allergy)   Discussed the use of AI scribe software for clinical note transcription with the patient, who gave verbal consent to proceed.  History of Present Illness   The patient is a 74 year old with a history of myalgia and elevated CK levels who presents for evaluation of statin intolerance and gastrointestinal symptoms.  They have a history of myalgia which led to the discontinuation of statin therapy. Despite stopping the statin, CK levels have remained elevated. They are unsure if they have always had high CK levels. Their work involves sporadic physical activity, including lifting heavy objects, which they believe might contribute to the elevated CK levels.  They have a history of diverticulosis and have experienced diverticulitis flares in the past, with the last significant flare occurring in 2024. They undergo regular colonoscopies due to a history of polyps. They experience occasional  bowel  discomfort, attributed to gas, and note that their bowel habits are generally regular. They manage symptoms with dietary adjustments, including increased water and fiber intake.  They are currently taking Apriso , four tablets daily, for their gastrointestinal condition. They have previously been prescribed Flagyl  and Bactrim  but did not take them during a recent episode as diagnostic imaging did not show diverticulitis. They occasionally use hyoscyamine  for bowel discomfort, which they find effective.  They mention occasional blood in the stool, described as 'mucousy, frank kind of blood,' but note it is minimal and possibly related to hemorrhoids. They have a history of large hemorrhoids.  They discuss their dietary habits, including limited milk consumption, and express interest in determining if lactose intolerance may be contributing to their symptoms. They consume milk primarily in coffee and occasionally with cookies or cereal.       She  reports that she has never smoked. She has never used smokeless tobacco. She reports current alcohol use of about 1.0 standard drink of alcohol per week. She reports that she does not use drugs.  Current Medications:     Current Outpatient Medications (Respiratory):    azelastine  (ASTELIN ) 0.1 % nasal spray, Place 1 spray into both nostrils 2 (two) times daily. Use in each nostril as directed   fluticasone  (FLONASE ) 50 MCG/ACT nasal spray, Place 2 sprays into both nostrils daily. (Patient taking differently: Place 2 sprays into both nostrils daily as needed.)    Current Outpatient Medications (Other):    Bioflavonoid Products (VITAMIN C) CHEW, Chew 1 tablet by mouth daily in the afternoon.   dicyclomine  (BENTYL ) 20 MG tablet, Take 1 tablet (20 mg total) by mouth 3 (three) times daily as needed for spasms.   Multiple Vitamin (MULTIVITAMIN) tablet, Take 1 tablet by mouth daily.   hyoscyamine  (ANASPAZ ) 0.125 MG TBDP disintergrating tablet, Place 1  tablet (0.125 mg total) under the tongue every 4 (four) hours as needed for cramping.   mesalamine  (APRISO ) 0.375 g 24 hr capsule, Take 4 capsules (1.5 g total) by mouth daily.  Medical History:  Past Medical History:  Diagnosis Date   Arthritis    Diverticulosis    History of chicken pox    History of gallstones    Tubular adenoma 2015   Per Colonoscopy/q76yrs   Ulcerative colitis (HCC)    Allergies:  Allergies  Allergen Reactions   Penicillin G Other (See Comments)    As a kid had a reaction    Mango Flavoring Agent (Non-Screening)      Surgical History:  She  has a past surgical history that includes Cholecystectomy (1991); Breast biopsy (Left, 2015); Breast excisional biopsy (Left); Retinal tear repair cryotherapy (Right, 05/18/2020); Colonoscopy; Esophagogastroduodenoscopy; and Replacement total knee (03/02/2021). Family History:  Her family history includes Alcohol abuse in her father; Arthritis in her mother; Bladder Cancer in her sister; COPD in her mother; Colon cancer in her maternal aunt; Crohn's disease in her son; Depression in her mother; Early death (age of onset: 104) in her father; Hyperlipidemia in her father; Hypertension in her father and sister.  REVIEW OF SYSTEMS  : All other systems reviewed and negative except where noted in the History of Present Illness.  PHYSICAL EXAM: BP 122/74 (BP Location: Right Arm, Patient Position: Sitting, Cuff Size: Normal)   Pulse 84   Ht 5' 4 (1.626 m)   Wt 144 lb (65.3 kg)   SpO2 98%   BMI 24.72 kg/m  Physical Exam   GENERAL APPEARANCE: Well nourished, in no  apparent distress HEENT: No cervical lymphadenopathy, unremarkable thyroid , sclerae anicteric, conjunctiva pink RESPIRATORY: Respiratory effort normal, BS equal bilateral without rales, rhonchi, wheezing CARDIO: RRR with no MRGs, peripheral pulses intact ABDOMEN: Soft, non distended, active bowel sounds in all 4 quadrants, no tenderness to palpation, no rebound, no  mass appreciated RECTAL: declines MUSCULOSKELETAL: Full ROM, normal gait, without edema SKIN: Dry, intact without rashes or lesions. No jaundice. NEURO: Alert, oriented, no focal deficits PSYCH: Cooperative, normal mood and affect.       RELEVANT LABS AND IMAGING: CBC    Component Value Date/Time   WBC 4.8 05/28/2024 0814   RBC 4.30 05/28/2024 0814   HGB 13.4 05/28/2024 0814   HCT 40.3 05/28/2024 0814   PLT 235.0 05/28/2024 0814   MCV 93.7 05/28/2024 0814   MCHC 33.2 05/28/2024 0814   RDW 13.0 05/28/2024 0814   LYMPHSABS 1.8 05/28/2024 0814   MONOABS 0.4 05/28/2024 0814   EOSABS 0.2 05/28/2024 0814   BASOSABS 0.0 05/28/2024 0814    CMP     Component Value Date/Time   NA 139 05/28/2024 0814   NA 143 03/12/2015 0000   K 4.3 05/28/2024 0814   CL 103 05/28/2024 0814   CO2 30 05/28/2024 0814   GLUCOSE 82 05/28/2024 0814   BUN 12 05/28/2024 0814   BUN 13 03/12/2015 0000   CREATININE 0.61 05/28/2024 0814   CALCIUM  9.3 05/28/2024 0814   PROT 6.6 05/28/2024 0814   ALBUMIN 4.1 05/28/2024 0814   AST 16 05/28/2024 0814   ALT 14 05/28/2024 0814   ALKPHOS 45 05/28/2024 0814   BILITOT 0.9 05/28/2024 0814     Alan JONELLE Coombs, PA-C 9:40 AM

## 2024-05-27 NOTE — Telephone Encounter (Signed)
 Crystal Blair, are you able to add on CBC, CMET and Sed rate to this patient's labs for tomorrow? She will follow up at our office on Wednesday and did not want to have 2 lab draws.

## 2024-05-27 NOTE — Telephone Encounter (Signed)
 Upcoming appt with you this week, 6-week follow up for diverticulitis. Any labs needed?

## 2024-05-27 NOTE — Addendum Note (Signed)
 Addended by: Rosario Kushner N on: 05/27/2024 03:57 PM   Modules accepted: Orders

## 2024-05-27 NOTE — Telephone Encounter (Signed)
 Patient called and stated that she would like to know if Alan is going to have her do labs because if she is she would like us  to send those orders to her PCP because she is having lab work done there as well and does not want to be poked twice. Patient is requesting a call back. Please advise.

## 2024-05-28 ENCOUNTER — Ambulatory Visit: Payer: Self-pay | Admitting: Nurse Practitioner

## 2024-05-28 ENCOUNTER — Other Ambulatory Visit (INDEPENDENT_AMBULATORY_CARE_PROVIDER_SITE_OTHER)

## 2024-05-28 ENCOUNTER — Ambulatory Visit: Payer: Self-pay | Admitting: Physician Assistant

## 2024-05-28 DIAGNOSIS — K50119 Crohn's disease of large intestine with unspecified complications: Secondary | ICD-10-CM

## 2024-05-28 DIAGNOSIS — R748 Abnormal levels of other serum enzymes: Secondary | ICD-10-CM

## 2024-05-28 DIAGNOSIS — E7800 Pure hypercholesterolemia, unspecified: Secondary | ICD-10-CM

## 2024-05-28 DIAGNOSIS — M791 Myalgia, unspecified site: Secondary | ICD-10-CM | POA: Diagnosis not present

## 2024-05-28 DIAGNOSIS — K5732 Diverticulitis of large intestine without perforation or abscess without bleeding: Secondary | ICD-10-CM

## 2024-05-28 DIAGNOSIS — R109 Unspecified abdominal pain: Secondary | ICD-10-CM

## 2024-05-28 DIAGNOSIS — I700 Atherosclerosis of aorta: Secondary | ICD-10-CM

## 2024-05-28 LAB — CBC WITH DIFFERENTIAL/PLATELET
Basophils Absolute: 0 K/uL (ref 0.0–0.1)
Basophils Relative: 0.7 % (ref 0.0–3.0)
Eosinophils Absolute: 0.2 K/uL (ref 0.0–0.7)
Eosinophils Relative: 3.2 % (ref 0.0–5.0)
HCT: 40.3 % (ref 36.0–46.0)
Hemoglobin: 13.4 g/dL (ref 12.0–15.0)
Lymphocytes Relative: 36.6 % (ref 12.0–46.0)
Lymphs Abs: 1.8 K/uL (ref 0.7–4.0)
MCHC: 33.2 g/dL (ref 30.0–36.0)
MCV: 93.7 fl (ref 78.0–100.0)
Monocytes Absolute: 0.4 K/uL (ref 0.1–1.0)
Monocytes Relative: 8.5 % (ref 3.0–12.0)
Neutro Abs: 2.5 K/uL (ref 1.4–7.7)
Neutrophils Relative %: 51 % (ref 43.0–77.0)
Platelets: 235 K/uL (ref 150.0–400.0)
RBC: 4.3 Mil/uL (ref 3.87–5.11)
RDW: 13 % (ref 11.5–15.5)
WBC: 4.8 K/uL (ref 4.0–10.5)

## 2024-05-28 LAB — COMPREHENSIVE METABOLIC PANEL WITH GFR
ALT: 14 U/L (ref 0–35)
AST: 16 U/L (ref 0–37)
Albumin: 4.1 g/dL (ref 3.5–5.2)
Alkaline Phosphatase: 45 U/L (ref 39–117)
BUN: 12 mg/dL (ref 6–23)
CO2: 30 meq/L (ref 19–32)
Calcium: 9.3 mg/dL (ref 8.4–10.5)
Chloride: 103 meq/L (ref 96–112)
Creatinine, Ser: 0.61 mg/dL (ref 0.40–1.20)
GFR: 90.49 mL/min (ref >60.00)
Glucose, Bld: 82 mg/dL (ref 70–99)
Potassium: 4.3 meq/L (ref 3.5–5.1)
Sodium: 139 meq/L (ref 135–145)
Total Bilirubin: 0.9 mg/dL (ref 0.2–1.2)
Total Protein: 6.6 g/dL (ref 6.0–8.3)

## 2024-05-28 LAB — SEDIMENTATION RATE: Sed Rate: 6 mm/h (ref 0–30)

## 2024-05-28 LAB — CK: Total CK: 456 U/L — ABNORMAL HIGH (ref 7–177)

## 2024-05-28 NOTE — Progress Notes (Signed)
 Agree with the assessment and plan as outlined by Quentin Mulling, PA-C. ? ?Keron Neenan, DO, FACG ? ?

## 2024-05-29 ENCOUNTER — Ambulatory Visit: Admitting: Physician Assistant

## 2024-05-29 ENCOUNTER — Encounter: Payer: Self-pay | Admitting: Physician Assistant

## 2024-05-29 VITALS — BP 122/74 | HR 84 | Ht 64.0 in | Wt 144.0 lb

## 2024-05-29 DIAGNOSIS — K5732 Diverticulitis of large intestine without perforation or abscess without bleeding: Secondary | ICD-10-CM

## 2024-05-29 DIAGNOSIS — Z860101 Personal history of adenomatous and serrated colon polyps: Secondary | ICD-10-CM

## 2024-05-29 DIAGNOSIS — K5730 Diverticulosis of large intestine without perforation or abscess without bleeding: Secondary | ICD-10-CM

## 2024-05-29 DIAGNOSIS — K649 Unspecified hemorrhoids: Secondary | ICD-10-CM

## 2024-05-29 DIAGNOSIS — Z8719 Personal history of other diseases of the digestive system: Secondary | ICD-10-CM

## 2024-05-29 DIAGNOSIS — Z860100 Personal history of colon polyps, unspecified: Secondary | ICD-10-CM

## 2024-05-29 DIAGNOSIS — R109 Unspecified abdominal pain: Secondary | ICD-10-CM

## 2024-05-29 DIAGNOSIS — K50119 Crohn's disease of large intestine with unspecified complications: Secondary | ICD-10-CM

## 2024-05-29 NOTE — Patient Instructions (Addendum)
 First do a trial off milk/lactose products if you use them.  Add fiber like benefiber or citracel once a day Increase activity Can do trial of IBGard which is over the counter for AB pain- Take 1-2 capsules once a day for maintence or twice a day during a flare Can send in an anti spasm medication, Bentyl  or levsin , to take as needed    FODMAP stands for fermentable oligo-, di-, mono-saccharides and polyols (1). These are the scientific terms used to classify groups of carbs that are difficult for our body to digest and that are notorious for triggering digestive symptoms like bloating, gas, loose stools and stomach pain.   You can try low FODMAP diet  - start with eliminating just one column at a time that you feel may be a trigger for you. - the table at the very bottom contains foods that are low in FODMAPs   Sometimes trying to eliminate the FODMAP's from your diet is difficult or tricky, if you are stuggling with trying to do the elimination diet you can try an enzyme.  There is a food enzymes that you sprinkle in or on your food that helps break down the FODMAP. You can read more about the enzyme by going to this site: https://fodzyme.com/   Small intestinal bacterial overgrowth (SIBO) occurs when there is an abnormal increase in the overall bacterial population in the small intestine -- particularly types of bacteria not commonly found in that part of the digestive tract. Small intestinal bacterial overgrowth (SIBO) commonly results when a circumstance -- such as surgery or disease -- slows the passage of food and waste products in the digestive tract, creating a breeding ground for bacteria.  Signs and symptoms of SIBO often include: Loss of appetite Abdominal pain Nausea Bloating An uncomfortable feeling of fullness after eating Diarrhea or constipation, depending on the type of gas produced  What foods trigger SIBO? While foods aren't the original cause of SIBO, certain  foods do encourage the overgrowth of the wrong bacteria in your small intestine. If you're feeding them their favorite foods, they're going to grow more, and that will trigger more of your SIBO symptoms. By the same token, you can help reduce the overgrowth by starving the problematic bacteria of their favorite foods. This strategy has led to a number of proposed SIBO eating plans. The plans vary, and so do individual results. But in general, they tend to recommend limiting carbohydrates.  These include: Sugars and sweeteners. Fruits and starchy vegetables. Dairy products. Grains.  There is a test for this we can do called a breath test, if you are positive we will treat you with an antibiotic to see if it helps.  Your symptoms are very suspicious for this condition, as discussed, we will start you on an antibiotic to see if this helps.   I appreciate the  opportunity to care for you  Thank You   [facility]

## 2024-05-30 NOTE — Addendum Note (Signed)
 Addended by: KATHEEN ROSELIE CROME on: 05/30/2024 02:43 PM   Modules accepted: Orders

## 2024-05-30 NOTE — Telephone Encounter (Signed)
 Called patient and got her scheduled for a 3 month lab only appointment.

## 2024-06-05 NOTE — Progress Notes (Signed)
 Agree with the assessment and plan as outlined by Alan Coombs, PA-C.   With the CT on 04/17/2024 being largely unremarkable and her doing well, if she prefers to hold off on colonoscopy until 07/2025, I think that would be reasonable.  Would plan for 40-month follow-up and can potentially do fecal calprotectin at that time to gauge any degree of inflammation.  If recurrent symptoms, however, can plan for colonoscopy earlier and would plan to be done with pediatric colonoscope or ultraslim scope given known history of SCAD.  Vernice Bowker, DO, [facility]

## 2024-07-05 DIAGNOSIS — H524 Presbyopia: Secondary | ICD-10-CM | POA: Diagnosis not present

## 2024-07-05 DIAGNOSIS — H2513 Age-related nuclear cataract, bilateral: Secondary | ICD-10-CM | POA: Diagnosis not present

## 2024-07-05 DIAGNOSIS — H0102B Squamous blepharitis left eye, upper and lower eyelids: Secondary | ICD-10-CM | POA: Diagnosis not present

## 2024-07-05 DIAGNOSIS — H43813 Vitreous degeneration, bilateral: Secondary | ICD-10-CM | POA: Diagnosis not present

## 2024-07-05 DIAGNOSIS — H31091 Other chorioretinal scars, right eye: Secondary | ICD-10-CM | POA: Diagnosis not present

## 2024-07-12 ENCOUNTER — Ambulatory Visit

## 2024-07-12 ENCOUNTER — Encounter: Payer: Self-pay | Admitting: Nurse Practitioner

## 2024-07-12 VITALS — BP 127/82 | HR 76 | Resp 15 | Ht 64.0 in | Wt 142.0 lb

## 2024-07-12 DIAGNOSIS — R748 Abnormal levels of other serum enzymes: Secondary | ICD-10-CM | POA: Diagnosis not present

## 2024-07-12 DIAGNOSIS — M8580 Other specified disorders of bone density and structure, unspecified site: Secondary | ICD-10-CM | POA: Diagnosis not present

## 2024-07-12 DIAGNOSIS — S32030D Wedge compression fracture of third lumbar vertebra, subsequent encounter for fracture with routine healing: Secondary | ICD-10-CM

## 2024-07-12 DIAGNOSIS — M1990 Unspecified osteoarthritis, unspecified site: Secondary | ICD-10-CM | POA: Diagnosis not present

## 2024-07-12 DIAGNOSIS — R5383 Other fatigue: Secondary | ICD-10-CM

## 2024-07-12 NOTE — Progress Notes (Signed)
 Office Visit Note  Patient: Crystal Blair             Date of Birth: 21-Feb-1951           MRN: [id number]             PCP: Crystal Roselie Rockford, NP Referring: Crystal Roselie Rockford, NP Visit Date: 07/12/2024  Subjective:  No chief complaint on file.  History of Present Illness: Crystal Blair is a 73 y.o. female who presents today due to elevated CK level.  Patient states that she was found to have an elevated CK level on a check by her provider after starting statin therapy. She does not believe this was ever checked in the past. She states that after her CK was elevated, her provider decreased her statin to 2x weekly, with her CK level still remaining elevated. She was told to stop her statin medication, and her CK was still elevated, prompting referral to rheumatology.  She states that she has not noticed any new symptoms since starting her statin, and that she has occasionally had muscle cramps for many years. She notices that her muscle cramps are associated when she does not stay hydrated throughout the day or if she has increased her activity that day leading to soreness. She admits to improvement with mustard and quinine water. She denies any muscle weakness. She denies any new rashes, admits to hx of rosacea.   She denies any photosensitivity, raynaud's (some purple discoloration of dorsal hand, no tri-color change; has occurred twice), no oral palate ulcers, she admits to dry eyes, possible mild dry mouth, no fevers, unexplainable weight loss.  She has a personal hx of UC dx ~4 years ago, tx w/ mesalamine . She admits to family hx of son who had crohn's dx.   Activities of Daily Living:  Patient reports morning stiffness for 0 minutes.   Patient Denies nocturnal pain.  Difficulty dressing/grooming: Denies Difficulty climbing stairs: Denies Difficulty getting out of chair: Denies Difficulty using hands for taps, buttons, cutlery, and/or writing: Denies  Review of  Systems  Constitutional:  Positive for fatigue.  HENT:  Positive for mouth dryness. Negative for mouth sores.   Eyes:  Positive for dryness.  Respiratory:  Negative for shortness of breath.   Cardiovascular:  Negative for chest pain and palpitations.  Gastrointestinal:  Negative for blood in stool, constipation and diarrhea.  Endocrine: Negative for increased urination.  Genitourinary:  Negative for involuntary urination.  Musculoskeletal:  Positive for joint pain, joint pain, myalgias and myalgias. Negative for gait problem, joint swelling, muscle weakness, morning stiffness and muscle tenderness.  Skin:  Positive for hair loss and redness. Negative for color change, rash and sensitivity to sunlight.  Allergic/Immunologic: Negative for susceptible to infections.  Neurological:  Negative for dizziness and headaches.  Hematological:  Negative for swollen glands.  Psychiatric/Behavioral:  Positive for sleep disturbance. Negative for depressed mood. The patient is not nervous/anxious.      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   Elevated C-reactive protein (CRP) 07/26/2023   Aortic atherosclerosis (HCC) 07/18/2023   Chronic maxillary sinusitis 09/29/2022   History of retinal tear 07/20/2022   Pure hypercholesterolemia 07/20/2022   Chronic right shoulder pain 07/20/2022   Ulcerative colitis (HCC) 04/08/2021   History of adenomatous polyp of colon 01/31/2019    Past Medical History:  Diagnosis Date  Arthritis    Diverticulosis    History of chicken pox    History of gallstones    Tubular adenoma 2015   Per Colonoscopy/q74yrs   Ulcerative colitis (HCC)     Family History  Problem Relation Age of Onset   Arthritis Mother    COPD Mother    Depression Mother    Alcohol abuse Father    Early death Father 55   Hyperlipidemia Father    Hypertension Father     Hypertension Sister    Bladder Cancer Sister    Colon cancer Maternal Aunt    Crohn's disease Son    Drug abuse Son    Healthy Son    Esophageal cancer Neg Hx    Past Surgical History:  Procedure Laterality Date   BREAST BIOPSY Left 2015   BREAST EXCISIONAL BIOPSY Left    CHOLECYSTECTOMY  1991   COLONOSCOPY     around 2016   ESOPHAGOGASTRODUODENOSCOPY     around 1993. Dr Crystal Blair.    REPLACEMENT TOTAL KNEE Left 03/02/2021   RETINAL TEAR REPAIR CRYOTHERAPY Right 05/18/2020   Social History   Social History Narrative   Not on file   Immunization History  Administered Date(s) Administered   Fluad Quad(high Dose 65+) 07/31/2019, 09/29/2022   Fluad Trivalent(High Dose 65+) 07/26/2023   Influenza, High Dose Seasonal PF 08/09/2021   Influenza-Unspecified 10/19/2020, 08/09/2021   Moderna Sars-Covid-2 Vaccination 09/18/2023   PFIZER(Purple Top)SARS-COV-2 Vaccination 01/12/2020, 02/05/2020, 09/01/2020, 08/09/2021   PNEUMOCOCCAL CONJUGATE-20 07/20/2022   Pfizer(Comirnaty)Fall Seasonal Vaccine 12 years and older 10/27/2022   Pneumococcal Conjugate-13 09/11/2019   Tdap 12/30/2021     Objective: Vital Signs: BP 127/82 (BP Location: Right Arm, Patient Position: Sitting, Cuff Size: Normal)   Pulse 76   Resp 15   Ht 5' 4 (1.626 m)   Wt 142 lb (64.4 kg)   BMI 24.37 kg/m    Physical Exam Vitals and nursing note reviewed.  Constitutional:      Appearance: She is well-developed.  HENT:     Head: Normocephalic and atraumatic.  Eyes:     Conjunctiva/sclera: Conjunctivae normal.  Cardiovascular:     Rate and Rhythm: Normal rate and regular rhythm.     Heart sounds: Normal heart sounds.  Pulmonary:     Effort: Pulmonary effort is normal.     Breath sounds: Normal breath sounds.  Abdominal:     General: Bowel sounds are normal.     Palpations: Abdomen is soft.  Musculoskeletal:     Cervical back: Normal range of motion.  Skin:    General: Skin is warm and dry.      Capillary Refill: Capillary refill takes less than 2 seconds.     Comments: Nailfold capillaroscopy without significant capillary loop dilation or drop out  Neurological:     Mental Status: She is alert and oriented to person, place, and time.     Motor: No weakness (5/5 proximal and distal UE and LE).  Psychiatric:        Behavior: Behavior normal.      Musculoskeletal Exam:   CDAI Exam: CDAI Score: -- Patient Global: --; Provider Global: -- Swollen: 0 ; Tender: 0  Joint Exam 07/12/2024   All documented joints were normal     Investigation: No additional findings.  Imaging: No results found.  Recent Labs: Lab Results  Component Value Date   WBC 4.8 05/28/2024   HGB 13.4 05/28/2024   PLT 235.0 05/28/2024   NA 139 05/28/2024  K 4.3 05/28/2024   CL 103 05/28/2024   CO2 30 05/28/2024   GLUCOSE 82 05/28/2024   BUN 12 05/28/2024   CREATININE 0.61 05/28/2024   BILITOT 0.9 05/28/2024   ALKPHOS 45 05/28/2024   AST 16 05/28/2024   ALT 14 05/28/2024   PROT 6.6 05/28/2024   ALBUMIN 4.1 05/28/2024   CALCIUM  9.3 05/28/2024    Speciality Comments: No specialty comments available.  Procedures:  No procedures performed Allergies: Penicillin g and Mango flavoring agent (non-screening)   Assessment / Plan:     Visit Diagnoses:   Elevated CK  Patient with mildly elevated CK level of unclear etiology. It is unclear if this is a new finding given no prior hx of CK level elevation, nor any symptoms to associate with when this could have occurred. She admits to occasional muscle cramping with clear instigating factors of poor water intake that improves when she remains hydrated and with mustard seed oil. She has no muscle weakness, rashes, true tri-color raynaud's, or SOB that would raise suspicion for an inflammatory myopathy. Given lack of any systemic symptoms, it makes an inflammatory myopathy unlikely, but will check myositis panel to rule out any possible early  inflammatory myopathy presentation. Will also recheck CK and aldolase today, as well as TSH and T4 to rule out hypothyroidism as possible etiology of elevated CK levels. Given that she has been on a statin, will also plan to obtain HMGCOA abs to rule out any possible statin induced inflammatory myopathy, however suspicion remains low given lack of systemic symptoms.   Osteopenia, unspecified location Compression fracture of L3 vertebra with routine healing, subsequent encounter Patient with prior DXA scan from 2023 with osteopenia, and recent CT A/P w/ old L3 compression fracture incidentally found, suggestive of likely dx of  osteoporosis. DXA order placed by primary, advised patient to proceed with scheduling and will defer any treatment to PCP. Patient was also advised to ensure intake of 1000-1200mg  calcium  and 800 international units Vitamin D  daily.    Orders: Orders Placed This Encounter  Procedures   CK   Aldolase   Myositis Specific II Antibodies Panel   TSH + free T4   Anti-HMGCR Ab IgG   TSH + free T4   No orders of the defined types were placed in this encounter.   Face-to-face time spent with patient was 60 minutes. Greater than 50% of time was spent in counseling and coordination of care.  Follow-Up Instructions: Return in about 4 weeks (around 08/09/2024).   Asberry Claw, DO

## 2024-07-17 LAB — TSH+FREE T4: TSH W/REFLEX TO FT4: 0.76 m[IU]/L (ref 0.40–4.50)

## 2024-07-17 LAB — MYOSITIS SPECIFIC II ANTIBODIES PANEL
EJ AB: <11 SI (ref <11)
JO-1 AB: <11 SI (ref <11)
MDA-5 AB: <11 SI (ref <11)
MI-2 ALPHA AB: <11 SI (ref <11)
MI-2 BETA AB: <11 SI (ref <11)
NXP-2 AB: <11 SI (ref <11)
OJ AB: <11 SI (ref <11)
PL-12 AB: <11 SI (ref <11)
PL-7 AB: <11 SI (ref <11)
SRP-AB: <11 SI (ref <11)
TIF-1y AB: <11 SI (ref <11)

## 2024-07-17 LAB — ALDOLASE: Aldolase: 4 U/L (ref <=8.1)

## 2024-07-18 ENCOUNTER — Other Ambulatory Visit: Payer: Self-pay

## 2024-07-19 ENCOUNTER — Other Ambulatory Visit: Payer: Self-pay | Admitting: *Deleted

## 2024-07-19 DIAGNOSIS — R748 Abnormal levels of other serum enzymes: Secondary | ICD-10-CM | POA: Diagnosis not present

## 2024-07-24 ENCOUNTER — Encounter: Payer: Self-pay | Admitting: Nurse Practitioner

## 2024-07-24 ENCOUNTER — Ambulatory Visit: Payer: Self-pay

## 2024-07-24 ENCOUNTER — Ambulatory Visit: Admitting: Nurse Practitioner

## 2024-07-24 VITALS — BP 128/76 | HR 88 | Temp 98.5°F | Ht 64.0 in | Wt 144.6 lb

## 2024-07-24 DIAGNOSIS — Z23 Encounter for immunization: Secondary | ICD-10-CM | POA: Diagnosis not present

## 2024-07-24 DIAGNOSIS — R748 Abnormal levels of other serum enzymes: Secondary | ICD-10-CM

## 2024-07-24 DIAGNOSIS — Z0000 Encounter for general adult medical examination without abnormal findings: Secondary | ICD-10-CM | POA: Diagnosis not present

## 2024-07-24 LAB — HMGCR AB (IGG): HMGCR AB (IGG): <2 CU (ref <20)

## 2024-07-24 LAB — CK: Total CK: 450 U/L — ABNORMAL HIGH (ref 18–225)

## 2024-07-24 NOTE — Patient Instructions (Addendum)
 Schedule Bone density at the breast center [phone] Maintain lab appt

## 2024-07-24 NOTE — Progress Notes (Signed)
 Complete physical exam  Patient: Crystal Blair   DOB: 1951-08-05   73 y.o. Female  MRN: [id number] Visit Date: 07/24/2024  Subjective:    Chief Complaint  Patient presents with   Follow-up    NOT FASTING 6 month follow up  Discuss COVID vaccine and wants flu vaccine    Crystal Blair is a 73 y.o. female who presents today for a complete physical exam. She reports consuming a general diet. Ocassional exercise She generally feels well. She reports sleeping well. She does have additional problems to discuss today.  Vision:Yes Dental:Yes STD Screen:No  BP Readings from Last 3 Encounters:  07/24/24 128/76  07/12/24 127/82  05/29/24 122/74   Wt Readings from Last 3 Encounters:  07/24/24 144 lb 9.6 oz (65.6 kg)  07/12/24 142 lb (64.4 kg)  05/29/24 144 lb (65.3 kg)    Most recent fall risk assessment:    09/15/2023    3:04 PM  Fall Risk   Falls in the past year? 0  Number falls in past yr: 0  Injury with Fall? 0  Risk for fall due to : Medication side effect  Follow up Falls prevention discussed;Falls evaluation completed     Depression screen:Yes - No Depression Most recent depression screenings:    09/15/2023    3:05 PM 07/26/2023    9:56 AM  PHQ 2/9 Scores  PHQ - 2 Score 0 0  PHQ- 9 Score 0 2    HPI  No problem-specific Assessment & Plan notes found for this encounter.   Past Medical History:  Diagnosis Date   Arthritis    Diverticulosis    History of chicken pox    History of gallstones    Tubular adenoma 2015   Per Colonoscopy/q70yrs   Ulcerative colitis (HCC)    Past Surgical History:  Procedure Laterality Date   BREAST BIOPSY Left 2015   BREAST EXCISIONAL BIOPSY Left    CHOLECYSTECTOMY  1991   COLONOSCOPY     around 2016   ESOPHAGOGASTRODUODENOSCOPY     around 1993. Dr Fidela.    EYE SURGERY  2021   JOINT REPLACEMENT  03/02/21   REPLACEMENT TOTAL KNEE Left 03/02/2021   RETINAL TEAR REPAIR CRYOTHERAPY Right 05/18/2020   Social  History   Socioeconomic History   Marital status: Widowed    Spouse name: Not on file   Number of children: 2   Years of education: Not on file   Highest education level: Not on file  Occupational History   Occupation: works part time  Tobacco Use   Smoking status: Never    Passive exposure: Past   Smokeless tobacco: Never  Vaping Use   Vaping status: Never Used  Substance and Sexual Activity   Alcohol use: Yes    Alcohol/week: 1.0 standard drink of alcohol    Types: 1 Glasses of wine per week    Comment: daily with dinner   Drug use: Never   Sexual activity: Yes  Other Topics Concern   Not on file  Social History Narrative   Not on file   Social Drivers of Health   Financial Resource Strain: Low Risk  (09/15/2023)   Overall Financial Resource Strain (CARDIA)    Difficulty of Paying Living Expenses: Not hard at all  Food Insecurity: No Food Insecurity (09/15/2023)   Hunger Vital Sign    Worried About Running Out of Food in the Last Year: Never true    Ran Out of Food in the Last  Year: Never true  Transportation Needs: No Transportation Needs (09/15/2023)   PRAPARE - Administrator, Civil Service (Medical): No    Lack of Transportation (Non-Medical): No  Physical Activity: Insufficiently Active (09/15/2023)   Exercise Vital Sign    Days of Exercise per Week: 3 days    Minutes of Exercise per Session: 30 min  Stress: Stress Concern Present (09/15/2023)   [employer] of Occupational Health - Occupational Stress Questionnaire    Feeling of Stress : To some extent  Social Connections: Moderately Integrated (09/15/2023)   Social Connection and Isolation Panel    Frequency of Communication with Friends and Family: More than three times a week    Frequency of Social Gatherings with Friends and Family: More than three times a week    Attends Religious Services: Never    Database administrator or Organizations: Yes    Attends Engineer, structural:  More than 4 times per year    Marital Status: Living with partner  Recent Concern: Social Connections - Moderately Isolated (09/15/2023)   Social Connection and Isolation Panel    Frequency of Communication with Friends and Family: More than three times a week    Frequency of Social Gatherings with Friends and Family: More than three times a week    Attends Religious Services: Never    Database administrator or Organizations: Yes    Attends Engineer, structural: More than 4 times per year    Marital Status: Widowed  Intimate Partner Violence: Not At Risk (09/15/2023)   Humiliation, Afraid, Rape, and Kick questionnaire    Fear of Current or Ex-Partner: No    Emotionally Abused: No    Physically Abused: No    Sexually Abused: No   Family Status  Relation Name Status   Mother Mother Deceased   Father Father Deceased   Sister Geographical information systems officer  (Not Specified)   MGM  Deceased   MGF  Deceased   PGM  Deceased   PGF  Deceased   Son  Deceased   Son  Alive   Neg Hx  (Not Specified)  No partnership data on file   Family History  Problem Relation Age of Onset   Arthritis Mother    COPD Mother    Depression Mother    Alcohol abuse Father    Early death Father 66   Hyperlipidemia Father    Hypertension Father    Hypertension Sister    Bladder Cancer Sister    Colon cancer Maternal Aunt    Crohn's disease Son    Drug abuse Son    Healthy Son    Esophageal cancer Neg Hx    Allergies  Allergen Reactions   Penicillin G Other (See Comments)    As a kid had a reaction  Other reaction(s): Not available, Other (See Comments)  As a kid had a reaction   Mango Flavoring Agent (Non-Screening)     Patient Care Team: Vicent Febles, Roselie Rockford, NP as PCP - General (Internal Medicine) Livingston Rigg, MD as Consulting Physician (Dermatology)   Medications: Outpatient Medications Prior to Visit  Medication Sig   azelastine  (ASTELIN ) 0.1 % nasal spray Place 1 spray into both  nostrils 2 (two) times daily. Use in each nostril as directed (Patient taking differently: Place 1 spray into both nostrils as needed. Use in each nostril as directed)   Bioflavonoid Products (VITAMIN C) CHEW Chew 1 tablet by mouth daily in  the afternoon.   dicyclomine  (BENTYL ) 20 MG tablet Take 1 tablet (20 mg total) by mouth 3 (three) times daily as needed for spasms.   fluticasone  (FLONASE ) 50 MCG/ACT nasal spray Place 2 sprays into both nostrils daily. (Patient taking differently: Place 2 sprays into both nostrils daily as needed.)   hyoscyamine  (ANASPAZ ) 0.125 MG TBDP disintergrating tablet Place 1 tablet (0.125 mg total) under the tongue every 4 (four) hours as needed for cramping.   mesalamine  (APRISO ) 0.375 g 24 hr capsule Take 4 capsules (1.5 g total) by mouth daily.   Multiple Vitamin (MULTIVITAMIN) tablet Take 1 tablet by mouth daily.   rosuvastatin  (CRESTOR ) 20 MG tablet 20mg  2x/week x 2months, then 3x/week continuously.   No facility-administered medications prior to visit.    Review of Systems  Constitutional:  Negative for activity change, appetite change and unexpected weight change.  Respiratory: Negative.    Cardiovascular: Negative.   Gastrointestinal: Negative.   Endocrine: Negative for cold intolerance and heat intolerance.  Genitourinary: Negative.   Musculoskeletal: Negative.   Skin: Negative.   Neurological: Negative.   Hematological: Negative.   Psychiatric/Behavioral:  Negative for behavioral problems, decreased concentration, dysphoric mood, hallucinations, self-injury, sleep disturbance and suicidal ideas. The patient is not nervous/anxious.     Last CBC Lab Results  Component Value Date   WBC 4.8 05/28/2024   HGB 13.4 05/28/2024   HCT 40.3 05/28/2024   MCV 93.7 05/28/2024   RDW 13.0 05/28/2024   PLT 235.0 05/28/2024   Last metabolic panel Lab Results  Component Value Date   GLUCOSE 82 05/28/2024   NA 139 05/28/2024   K 4.3 05/28/2024   CL 103  05/28/2024   CO2 30 05/28/2024   BUN 12 05/28/2024   CREATININE 0.61 05/28/2024   GFR 90.49 05/28/2024   CALCIUM  9.3 05/28/2024   PROT 6.6 05/28/2024   ALBUMIN 4.1 05/28/2024   BILITOT 0.9 05/28/2024   ALKPHOS 45 05/28/2024   AST 16 05/28/2024   ALT 14 05/28/2024   Last lipids Lab Results  Component Value Date   CHOL 155 01/22/2024   HDL 67.10 01/22/2024   LDLCALC 69 01/22/2024   TRIG 91.0 01/22/2024   CHOLHDL 2 01/22/2024   Last hemoglobin A1c Lab Results  Component Value Date   HGBA1C 5.5 02/17/2021   Last thyroid  functions Lab Results  Component Value Date   TSH 0.97 12/20/2022   Last vitamin D  Lab Results  Component Value Date   VD25OH 40.95 09/11/2019      Objective:  BP 128/76 (BP Location: Left Arm, Patient Position: Sitting, Cuff Size: Large)   Pulse 88   Temp 98.5 F (36.9 C) (Oral)   Ht 5' 4 (1.626 m)   Wt 144 lb 9.6 oz (65.6 kg) Comment: wearing heavy tennis shoes per patient  SpO2 98%   BMI 24.82 kg/m     Physical Exam Vitals and nursing note reviewed.  Constitutional:      General: She is not in acute distress. HENT:     Right Ear: Tympanic membrane, ear canal and external ear normal.     Left Ear: Tympanic membrane, ear canal and external ear normal.     Nose: Nose normal.  Eyes:     Extraocular Movements: Extraocular movements intact.     Conjunctiva/sclera: Conjunctivae normal.     Pupils: Pupils are equal, round, and reactive to light.  Neck:     Thyroid : No thyroid  mass, thyromegaly or thyroid  tenderness.  Cardiovascular:  Rate and Rhythm: Normal rate and regular rhythm.     Pulses: Normal pulses.     Heart sounds: Normal heart sounds.  Pulmonary:     Effort: Pulmonary effort is normal.     Breath sounds: Normal breath sounds.  Abdominal:     General: Bowel sounds are normal.     Palpations: Abdomen is soft.  Musculoskeletal:        General: Normal range of motion.     Cervical back: Normal range of motion and neck  supple.     Right lower leg: No edema.     Left lower leg: No edema.  Lymphadenopathy:     Cervical: No cervical adenopathy.  Skin:    General: Skin is warm and dry.  Neurological:     Mental Status: She is alert and oriented to person, place, and time.     Cranial Nerves: No cranial nerve deficit.  Psychiatric:        Mood and Affect: Mood normal.        Behavior: Behavior normal.        Thought Content: Thought content normal.      No results found for any visits on 07/24/24.    Assessment & Plan:    Routine Health Maintenance and Physical Exam  Immunization History  Administered Date(s) Administered   Fluad Quad(high Dose 65+) 07/31/2019, 09/29/2022, 07/26/2023   Fluad Trivalent(High Dose 65+) 07/26/2023   INFLUENZA, HIGH DOSE SEASONAL PF 08/09/2021, 07/24/2024   Influenza-Unspecified 10/19/2020, 08/09/2021, 09/29/2022   Moderna Covid-19 Fall Seasonal Vaccine 65yrs & older 09/18/2023   Moderna Sars-Covid-2 Vaccination 09/18/2023   PFIZER(Purple Top)SARS-COV-2 Vaccination 01/12/2020, 02/05/2020, 09/01/2020, 08/09/2021   PNEUMOCOCCAL CONJUGATE-20 07/20/2022   Pfizer Covid-19 Vaccine Bivalent Booster 12yrs & up 04/25/2022   Pfizer(Comirnaty)Fall Seasonal Vaccine 12 years and older 10/27/2022   Pneumococcal Conjugate,unspecified 07/20/2022   Pneumococcal Conjugate-13 09/11/2019   Tdap 12/30/2021   Zoster Recombinant(Shingrix) 10/08/2023, 12/08/2023    Health Maintenance  Topic Date Due   COVID-19 Vaccine (8 - 2025-26 season) 07/22/2024   Medicare Annual Wellness (AWV)  09/14/2024   MAMMOGRAM  02/11/2025   Colonoscopy  08/06/2025   DTaP/Tdap/Td (2 - Td or Tdap) 12/31/2031   Pneumococcal Vaccine: 50+ Years  Completed   INFLUENZA VACCINE  Completed   DEXA SCAN  Completed   Hepatitis C Screening  Completed   Zoster Vaccines- Shingrix  Completed   HPV VACCINES  Aged Out   Meningococcal B Vaccine  Aged Out    Discussed health benefits of physical activity, and  encouraged her to engage in regular exercise appropriate for her age and condition.  Problem List Items Addressed This Visit   None Visit Diagnoses       Preventative health care    -  Primary     Need for influenza vaccination       Relevant Orders   Flu vaccine HIGH DOSE PF(Fluzone Trivalent) (Completed)      Return in about 6 months (around 01/21/2025) for hyperlipidemia (fasting).     Roselie Mood, NP

## 2024-08-06 NOTE — Progress Notes (Signed)
 Office Visit Note  Patient: Crystal Blair             Date of Birth: January 09, 1951           MRN: [id number]             PCP: Katheen Roselie Rockford, NP Referring: Katheen Roselie Rockford, NP Visit Date: 08/19/2024 Occupation: Data Unavailable  Subjective:  Results   History of Present Illness: Crystal Blair is a 73 y.o. female who presents for result review. She was last seen as a new patient on 07/12/24 for an elevated CK level. Additional work-up was performed that demonstrated negative myositis panel and HMGCR abs, negative aldolase, and a still elevated CK (450, down slightly from 456).  She is currently taking her statin 3x a week now, denies any changes in her symptoms. She denies any worsening muscle cramps with increased dose. She also has been taking the COQ10.   She denies any new rashes. She denies any new muscle weakness.    Activities of Daily Living:  Patient reports morning stiffness for 0 minutes.   Patient Denies nocturnal pain.  Difficulty dressing/grooming: Denies Difficulty climbing stairs: Denies Difficulty getting out of chair: Denies Difficulty using hands for taps, buttons, cutlery, and/or writing: Denies  Review of Systems  Constitutional:  Positive for fatigue.  HENT:  Negative for mouth sores and mouth dryness.   Eyes:  Positive for dryness.  Respiratory:  Negative for shortness of breath.   Cardiovascular:  Negative for chest pain and palpitations.  Gastrointestinal:  Negative for blood in stool, constipation and diarrhea.  Endocrine: Positive for increased urination.  Genitourinary:  Negative for involuntary urination.  Musculoskeletal:  Positive for joint pain and joint pain. Negative for gait problem, joint swelling, myalgias, muscle weakness, morning stiffness, muscle tenderness and myalgias.  Skin:  Positive for hair loss and sensitivity to sunlight. Negative for color change and rash.  Allergic/Immunologic: Negative for susceptible to  infections.  Neurological:  Negative for dizziness and headaches.  Hematological:  Negative for swollen glands.  Psychiatric/Behavioral:  Negative for depressed mood and sleep disturbance. The patient is not nervous/anxious.     PMFS History:  Patient Active Problem List   Diagnosis Date Noted   Plantar fasciitis 07/26/2023   Tinnitus aurium, bilateral 07/26/2023   Elevated C-reactive protein (CRP) 07/26/2023   Aortic atherosclerosis 07/18/2023   Chronic maxillary sinusitis 09/29/2022   History of retinal tear 07/20/2022   Pure hypercholesterolemia 07/20/2022   Chronic right shoulder pain 07/20/2022   Ulcerative colitis (HCC) 04/08/2021   History of adenomatous polyp of colon 01/31/2019    Past Medical History:  Diagnosis Date   Arthritis    Diverticulosis    History of chicken pox    History of gallstones    Tubular adenoma 2015   Per Colonoscopy/q23yrs   Ulcerative colitis (HCC)     Family History  Problem Relation Age of Onset   Arthritis Mother    COPD Mother    Depression Mother    Alcohol abuse Father    Early death Father 77   Hyperlipidemia Father    Hypertension Father    Hypertension Sister    Bladder Cancer Sister    Crohn's disease Son    Drug abuse Son    Healthy Son    Colon cancer Maternal Aunt    Scleroderma Cousin    Esophageal cancer Neg Hx    Past Surgical History:  Procedure Laterality Date   BREAST BIOPSY Left  2015   BREAST EXCISIONAL BIOPSY Left    CHOLECYSTECTOMY  1991   COLONOSCOPY     around 2016   ESOPHAGOGASTRODUODENOSCOPY     around 1993. Dr Fidela.    EYE SURGERY  2021   JOINT REPLACEMENT  03/02/21   REPLACEMENT TOTAL KNEE Left 03/02/2021   RETINAL TEAR REPAIR CRYOTHERAPY Right 05/18/2020   Social History   Tobacco Use   Smoking status: Never    Passive exposure: Past   Smokeless tobacco: Never  Vaping Use   Vaping status: Never Used  Substance Use Topics   Alcohol use: Yes    Alcohol/week: 7.0 standard drinks of  alcohol    Types: 7 Glasses of wine per week    Comment: daily with dinner   Drug use: Never   Social History   Social History Narrative   Not on file     Immunization History  Administered Date(s) Administered   Fluad Quad(high Dose 65+) 07/31/2019, 09/29/2022, 07/26/2023   Fluad Trivalent(High Dose 65+) 07/26/2023   INFLUENZA, HIGH DOSE SEASONAL PF 08/09/2021, 07/24/2024   Influenza-Unspecified 10/19/2020, 08/09/2021, 09/29/2022   Moderna Covid-19 Fall Seasonal Vaccine 76yrs & older 09/18/2023   Moderna Sars-Covid-2 Vaccination 09/18/2023   PFIZER(Purple Top)SARS-COV-2 Vaccination 01/12/2020, 02/05/2020, 09/01/2020, 08/09/2021   PNEUMOCOCCAL CONJUGATE-20 07/20/2022   Pfizer Covid-19 Vaccine Bivalent Booster 6yrs & up 04/25/2022   Pfizer(Comirnaty)Fall Seasonal Vaccine 12 years and older 10/27/2022   Pneumococcal Conjugate,unspecified 07/20/2022   Pneumococcal Conjugate-13 09/11/2019   Tdap 12/30/2021   Zoster Recombinant(Shingrix) 10/08/2023, 12/08/2023     Objective: Vital Signs: BP 121/80   Pulse 83   Temp 97.6 F (36.4 C)   Resp 14   Ht 5' 4 (1.626 m)   Wt 143 lb (64.9 kg)   BMI 24.55 kg/m    Physical Exam Vitals and nursing note reviewed.  HENT:     Head: Normocephalic and atraumatic.     Nose: Nose normal.  Eyes:     Conjunctiva/sclera: Conjunctivae normal.     Pupils: Pupils are equal, round, and reactive to light.  Cardiovascular:     Rate and Rhythm: Normal rate and regular rhythm.     Heart sounds: Normal heart sounds.  Pulmonary:     Effort: Pulmonary effort is normal.     Breath sounds: Normal breath sounds.  Skin:    General: Skin is warm and dry.  Neurological:     Mental Status: She is alert. Mental status is at baseline.     Motor: No weakness (5/5 b/l proximal UE and LE).  Psychiatric:        Mood and Affect: Mood normal.        Behavior: Behavior normal.      Musculoskeletal Exam:   CDAI Exam: CDAI Score: -- Patient Global:  --; Provider Global: -- Swollen: 0 ; Tender: 0  Joint Exam 08/19/2024   All documented joints were normal     Investigation: No additional findings.  Imaging: No results found.  Recent Labs: Lab Results  Component Value Date   WBC 4.8 05/28/2024   HGB 13.4 05/28/2024   PLT 235.0 05/28/2024   NA 139 05/28/2024   K 4.3 05/28/2024   CL 103 05/28/2024   CO2 30 05/28/2024   GLUCOSE 82 05/28/2024   BUN 12 05/28/2024   CREATININE 0.61 05/28/2024   BILITOT 0.9 05/28/2024   ALKPHOS 45 05/28/2024   AST 16 05/28/2024   ALT 14 05/28/2024   PROT 6.6 05/28/2024   ALBUMIN 4.1 05/28/2024  CALCIUM  9.3 05/28/2024    Speciality Comments: No specialty comments available.  Procedures:  No procedures performed Allergies: Penicillin g and Mango flavoring agent (non-screening)   Assessment / Plan:     Visit Diagnoses:  Elevated CK Patient with mildly elevated CK level of unclear etiology. It is unclear if this is a new finding given no prior hx of CK level elevation, nor any symptoms to associate with when this could have occurred. Her work-up for inflammatory muscle disease (myositis panel and HMGCoA abs) were negative, as was her aldolase, which is reassuring. Given negative laboratory testing and lack of any systemic symptoms, an inflammatory myopathy is highly unlikely at this time. Discussed with patient to let our office know immediately if she develop any new symptoms. Will recheck CK in October. Message sent to patient's PCP to see if this can be added on to October 13th lab appointment.   Osteopenia, unspecified location Compression fracture of L3 vertebra with routine healing, subsequent encounter Patient with prior DXA scan from 2023 with osteopenia, and recent CT A/P w/ old L3 compression fracture incidentally found, suggestive of likely dx of  osteoporosis. DXA order placed by primary, advised patient to proceed with scheduling and will defer any treatment to PCP. Patient  advised to continue daily calcium  intake and 800u Vitamin D  daily.  Orders: Orders Placed This Encounter  Procedures   CK   No orders of the defined types were placed in this encounter.   I personally spent a total of 45 minutes in the care of the patient today including preparing to see the patient, getting/reviewing separately obtained history, performing a medically appropriate exam/evaluation, counseling and educating, placing orders, referring and communicating with other health care professionals, documenting clinical information in the EHR, and communicating results.   Follow-Up Instructions: Return in about 6 months (around 02/16/2025).   Asberry Claw, DO  Note - This record has been created using Animal nutritionist.  Chart creation errors have been sought, but may not always  have been located. Such creation errors do not reflect on  the standard of medical care.

## 2024-08-19 ENCOUNTER — Ambulatory Visit

## 2024-08-19 VITALS — BP 121/80 | HR 83 | Temp 97.6°F | Resp 14 | Ht 64.0 in | Wt 143.0 lb

## 2024-08-19 DIAGNOSIS — S32030D Wedge compression fracture of third lumbar vertebra, subsequent encounter for fracture with routine healing: Secondary | ICD-10-CM | POA: Diagnosis not present

## 2024-08-19 DIAGNOSIS — M8580 Other specified disorders of bone density and structure, unspecified site: Secondary | ICD-10-CM | POA: Diagnosis not present

## 2024-08-19 DIAGNOSIS — R748 Abnormal levels of other serum enzymes: Secondary | ICD-10-CM

## 2024-08-19 NOTE — Telephone Encounter (Signed)
-----   Message from Asberry Claw sent at 08/19/2024 12:13 PM EDT ----- Erskin Garden, My name is Asberry Claw, I am one of the new Rheumatologist at [facility] health, reaching out regarding a mutual patient, Ms. Tiede. I want to have her CK rechecked in October, and she says she has a lab appointment for you on October 13th I believe. Is there a way you can add a CK level to her upcoming lab appointment so she does not have to be pricked twice? I would appreciate your assistance.  Best, Asberry

## 2024-08-22 ENCOUNTER — Telehealth (HOSPITAL_BASED_OUTPATIENT_CLINIC_OR_DEPARTMENT_OTHER): Payer: Self-pay

## 2024-08-30 ENCOUNTER — Other Ambulatory Visit

## 2024-08-30 DIAGNOSIS — R748 Abnormal levels of other serum enzymes: Secondary | ICD-10-CM | POA: Diagnosis not present

## 2024-08-30 DIAGNOSIS — E7800 Pure hypercholesterolemia, unspecified: Secondary | ICD-10-CM | POA: Diagnosis not present

## 2024-08-30 DIAGNOSIS — I700 Atherosclerosis of aorta: Secondary | ICD-10-CM | POA: Diagnosis not present

## 2024-08-30 LAB — LIPID PANEL
Cholesterol: 153 mg/dL (ref 0–200)
HDL: 67.9 mg/dL (ref >39.00)
LDL Cholesterol: 73 mg/dL (ref 0–99)
NonHDL: 84.74
Total CHOL/HDL Ratio: 2
Triglycerides: 60 mg/dL (ref 0.0–149.0)
VLDL: 12 mg/dL (ref 0.0–40.0)

## 2024-08-31 LAB — CK: Total CK: 466 U/L — ABNORMAL HIGH (ref 18–225)

## 2024-09-02 ENCOUNTER — Ambulatory Visit: Payer: Self-pay | Admitting: Nurse Practitioner

## 2024-09-05 ENCOUNTER — Ambulatory Visit (HOSPITAL_BASED_OUTPATIENT_CLINIC_OR_DEPARTMENT_OTHER)
Admission: RE | Admit: 2024-09-05 | Discharge: 2024-09-05 | Disposition: A | Discharge status: Home / Self Care | Source: Ambulatory Visit | Attending: Nurse Practitioner | Admitting: Nurse Practitioner

## 2024-09-05 DIAGNOSIS — M8589 Other specified disorders of bone density and structure, multiple sites: Secondary | ICD-10-CM | POA: Diagnosis not present

## 2024-09-05 DIAGNOSIS — Z780 Asymptomatic menopausal state: Secondary | ICD-10-CM | POA: Diagnosis not present

## 2024-09-05 DIAGNOSIS — M810 Age-related osteoporosis without current pathological fracture: Secondary | ICD-10-CM | POA: Diagnosis not present

## 2024-09-11 ENCOUNTER — Ambulatory Visit: Payer: Self-pay | Admitting: Nurse Practitioner

## 2024-09-12 NOTE — Telephone Encounter (Signed)
 Copied from CRM #[id number]. Topic: Clinical - Lab/Test Results >> Sep 12, 2024 11:48 AM Dedra B wrote: Reason for CRM: Pt returning call for Terrah regarding test results. Called [facility] and was told that Joeseph will call pt back. Relayed message to pt. Pt said she just got to work so she will try calling back in 30 mins.

## 2024-09-12 NOTE — Telephone Encounter (Signed)
 Copied from CRM #[id number]. Topic: General - Call Back - No Documentation >> Sep 12, 2024 12:45 PM Shereese L wrote: Reason for CRM: Patient called in to return office call. Adv that nurse is on lunch, requesting call back

## 2024-09-13 NOTE — Telephone Encounter (Signed)
 Patient returned my call. She stated that she wants to research the medication first and that she will get back with Roselie about her decision. She wanted to know is she to start the calcium  BID and the daily vitamin d  now or when she makes a decision about the medication?

## 2024-09-18 ENCOUNTER — Other Ambulatory Visit (HOSPITAL_BASED_OUTPATIENT_CLINIC_OR_DEPARTMENT_OTHER)

## 2024-09-23 ENCOUNTER — Ambulatory Visit: Payer: Medicare Other

## 2024-09-23 DIAGNOSIS — Z0000 Encounter for general adult medical examination without abnormal findings: Secondary | ICD-10-CM

## 2024-09-23 NOTE — Progress Notes (Signed)
 Subjective:   Crystal Blair is a 73 y.o. who presents for a Medicare Wellness preventive visit.  As a reminder, Annual Wellness Visits don't include a physical exam, and some assessments may be limited, especially if this visit is performed virtually. We may recommend an in-person follow-up visit with your provider if needed.  Visit Complete: Virtual I connected with  Crystal Blair on 09/23/24 by a audio enabled telemedicine application and verified that I am speaking with the correct person using two identifiers.  Patient Location: Home  Provider Location: Office/Clinic  I discussed the limitations of evaluation and management by telemedicine. The patient expressed understanding and agreed to proceed.  Vital Signs: Because this visit was a virtual/telehealth visit, some criteria may be missing or patient reported. Any vitals not documented were not able to be obtained and vitals that have been documented are patient reported.  VideoError- Librarian, academic were attempted between this provider and patient, however failed, due to patient having technical difficulties OR patient did not have access to video capability.  We continued and completed visit with audio only.   Persons Participating in Visit: Patient.  AWV Questionnaire: Yes: Patient Medicare AWV questionnaire was completed by the patient on 09/22/2024; I have confirmed that all information answered by patient is correct and no changes since this date.        Objective:    Today's Vitals   There is no height or weight on file to calculate BMI.     09/23/2024    8:11 AM 09/15/2023    3:03 PM 07/12/2022    8:28 AM 12/30/2021   12:42 PM 12/11/2021    1:10 PM 07/06/2021    9:22 AM 06/25/2020    8:42 AM  Advanced Directives  Does Patient Have a Medical Advance Directive? Yes Yes Yes Yes Yes Yes No  Type of Estate Agent of [place];Living will Healthcare Power of  [place];Living will Healthcare Power of [place];Living will   Healthcare Power of Attorney   Copy of Healthcare Power of Attorney in Chart? No - copy requested No - copy requested No - copy requested   No - copy requested   Would patient like information on creating a medical advance directive?       Yes (MAU/Ambulatory/Procedural Areas - Information given)    Current Medications (verified) Outpatient Encounter Medications as of 09/23/2024  Medication Sig   Bioflavonoid Products (VITAMIN C) CHEW Chew 1 tablet by mouth daily in the afternoon.   Calcium  Carb-Cholecalciferol (CALTRATE 600+D3 PO) Take 1 tablet by mouth daily.   Coenzyme Q10 (CO Q 10 PO) Take 1 capsule by mouth daily.   dicyclomine  (BENTYL ) 20 MG tablet Take 1 tablet (20 mg total) by mouth 3 (three) times daily as needed for spasms.   fluticasone  (FLONASE ) 50 MCG/ACT nasal spray Place 2 sprays into both nostrils daily. (Patient taking differently: Place 2 sprays into both nostrils daily as needed.)   hyoscyamine  (ANASPAZ ) 0.125 MG TBDP disintergrating tablet Place 1 tablet (0.125 mg total) under the tongue every 4 (four) hours as needed for cramping.   mesalamine  (APRISO ) 0.375 g 24 hr capsule Take 4 capsules (1.5 g total) by mouth daily.   Multiple Vitamin (MULTIVITAMIN) tablet Take 1 tablet by mouth daily.   rosuvastatin  (CRESTOR ) 20 MG tablet 20mg  2x/week x 2months, then 3x/week continuously.   azelastine  (ASTELIN ) 0.1 % nasal spray Place 1 spray into both nostrils 2 (two) times daily. Use in each nostril as  directed (Patient not taking: Reported on 09/23/2024)   No facility-administered encounter medications on file as of 09/23/2024.    Allergies (verified) Penicillin g and Mango flavoring agent (non-screening)   History: Past Medical History:  Diagnosis Date   Arthritis    Diverticulosis    History of chicken pox    History of gallstones    Tubular adenoma 2015   Per Colonoscopy/q35yrs   Ulcerative colitis (HCC)     Past Surgical History:  Procedure Laterality Date   BREAST BIOPSY Left 2015   BREAST EXCISIONAL BIOPSY Left    CHOLECYSTECTOMY  1991   COLONOSCOPY     around 2016   ESOPHAGOGASTRODUODENOSCOPY     around 1993. Dr Fidela.    EYE SURGERY  2021   JOINT REPLACEMENT  03/02/21   REPLACEMENT TOTAL KNEE Left 03/02/2021   RETINAL TEAR REPAIR CRYOTHERAPY Right 05/18/2020   Family History  Problem Relation Age of Onset   Arthritis Mother    COPD Mother    Depression Mother    Alcohol abuse Father    Early death Father 64   Hyperlipidemia Father    Hypertension Father    Hypertension Sister    Bladder Cancer Sister    Crohn's disease Son    Drug abuse Son    Healthy Son    Colon cancer Maternal Aunt    Scleroderma Cousin    Esophageal cancer Neg Hx    Social History   Socioeconomic History   Marital status: Widowed    Spouse name: Not on file   Number of children: 2   Years of education: Not on file   Highest education level: Bachelor's degree (e.g., BA, AB, BS)  Occupational History   Occupation: works part time  Tobacco Use   Smoking status: Never    Passive exposure: Past   Smokeless tobacco: Never  Vaping Use   Vaping status: Never Used  Substance and Sexual Activity   Alcohol use: Yes    Alcohol/week: 7.0 standard drinks of alcohol    Types: 7 Glasses of wine per week    Comment: daily with dinner   Drug use: Never   Sexual activity: Yes  Other Topics Concern   Not on file  Social History Narrative   Not on file   Social Drivers of Health   Financial Resource Strain: Low Risk  (09/22/2024)   Overall Financial Resource Strain (CARDIA)    Difficulty of Paying Living Expenses: Not very hard  Food Insecurity: No Food Insecurity (09/22/2024)   Hunger Vital Sign    Worried About Running Out of Food in the Last Year: Never true    Ran Out of Food in the Last Year: Never true  Transportation Needs: No Transportation Needs (09/22/2024)   PRAPARE -  Administrator, Civil Service (Medical): No    Lack of Transportation (Non-Medical): No  Physical Activity: Insufficiently Active (09/22/2024)   Exercise Vital Sign    Days of Exercise per Week: 4 days    Minutes of Exercise per Session: 10 min  Stress: No Stress Concern Present (09/22/2024)   [employer] of Occupational Health - Occupational Stress Questionnaire    Feeling of Stress: Not at all  Social Connections: Moderately Integrated (09/22/2024)   Social Connection and Isolation Panel    Frequency of Communication with Friends and Family: More than three times a week    Frequency of Social Gatherings with Friends and Family: Three times a week    Attends  Religious Services: Patient declined    Active Member of Clubs or Organizations: Yes    Attends Banker Meetings: 1 to 4 times per year    Marital Status: Living with partner    Tobacco Counseling Counseling given: Not Answered    Clinical Intake:              Lab Results  Component Value Date   HGBA1C 5.5 02/17/2021   HGBA1C 5.4 03/12/2015        Interpreter Needed?: No      Activities of Daily Living     09/22/2024    8:23 AM  In your present state of health, do you have any difficulty performing the following activities:  Hearing? 0  Vision? 0  Difficulty concentrating or making decisions? 0  Walking or climbing stairs? 0  Dressing or bathing? 0  Doing errands, shopping? 0  Preparing Food and eating ? N  Using the Toilet? N  In the past six months, have you accidently leaked urine? N  Do you have problems with loss of bowel control? N  Managing your Medications? N  Managing your Finances? N  Housekeeping or managing your Housekeeping? N    Patient Care Team: Nche, Roselie Rockford, NP as PCP - General (Internal Medicine) Livingston Rigg, MD as Consulting Physician (Dermatology)  I have updated your Care Teams any recent Medical Services you may have received from  other providers in the past year.     Assessment:   This is a routine wellness examination for Nelida.  Hearing/Vision screen Hearing Screening - Comments:: Denies hearing issues Vision Screening - Comments:: Regular eye exams, [facility]   Goals Addressed             This Visit's Progress    Patient Stated       09/23/2024, stay alive and do more exercise       Depression Screen     09/15/2023    3:05 PM 07/26/2023    9:56 AM 09/29/2022   11:57 AM 07/20/2022    9:25 AM 07/12/2022    8:29 AM 07/12/2022    8:26 AM 07/16/2021   11:00 AM  PHQ 2/9 Scores  PHQ - 2 Score 0 0 2 0 0 0 0  PHQ- 9 Score 0 2 5 2   1     Fall Risk     09/23/2024    8:11 AM 09/22/2024    8:23 AM 09/15/2023    3:04 PM 07/26/2023    9:56 AM 09/29/2022   11:04 AM  Fall Risk   Falls in the past year? 1 0 0 1 1  Number falls in past yr: 0  0 0 0  Injury with Fall? 0  0 1 1  Risk for fall due to : Medication side effect  Medication side effect History of fall(s)   Follow up Falls prevention discussed;Falls evaluation completed  Falls prevention discussed;Falls evaluation completed Falls evaluation completed     MEDICARE RISK AT HOME:  Medicare Risk at Home Any stairs in or around the home?: (Patient-Rptd) No Home free of loose throw rugs in walkways, pet beds, electrical cords, etc?: (Patient-Rptd) Yes Adequate lighting in your home to reduce risk of falls?: (Patient-Rptd) Yes Life alert?: (Patient-Rptd) No Use of a cane, walker or w/c?: (Patient-Rptd) No Grab bars in the bathroom?: (Patient-Rptd) Yes Shower chair or bench in shower?: (Patient-Rptd) No Elevated toilet seat or a handicapped toilet?: (Patient-Rptd) No  TIMED UP AND GO:  Was the test performed?  No  Cognitive Function: 6CIT completed        09/23/2024    8:11 AM 09/15/2023    3:06 PM 07/12/2022    8:37 AM  6CIT Screen  What Year? 0 points 0 points 0 points  What month? 0 points 0 points 0 points  What time? 0 points 0 points 0  points  Count back from 20 0 points 0 points 0 points  Months in reverse 0 points 0 points 0 points  Repeat phrase 0 points 0 points 0 points  Total Score 0 points 0 points 0 points    Immunizations Immunization History  Administered Date(s) Administered   Fluad Quad(high Dose 65+) 07/31/2019, 09/29/2022, 07/26/2023   Fluad Trivalent(High Dose 65+) 07/26/2023   INFLUENZA, HIGH DOSE SEASONAL PF 08/09/2021, 07/24/2024   Influenza-Unspecified 10/19/2020, 08/09/2021, 09/29/2022   Moderna Covid-19 Fall Seasonal Vaccine 58yrs & older 09/18/2023   Moderna Sars-Covid-2 Vaccination 09/18/2023   PFIZER(Purple Top)SARS-COV-2 Vaccination 01/12/2020, 02/05/2020, 09/01/2020, 08/09/2021   PNEUMOCOCCAL CONJUGATE-20 07/20/2022   Pfizer Covid-19 Vaccine Bivalent Booster 71yrs & up 04/25/2022   Pfizer(Comirnaty)Fall Seasonal Vaccine 12 years and older 10/27/2022   Pneumococcal Conjugate,unspecified 07/20/2022   Pneumococcal Conjugate-13 09/11/2019   Tdap 12/30/2021   Zoster Recombinant(Shingrix) 10/08/2023, 12/08/2023    Screening Tests Health Maintenance  Topic Date Due   COVID-19 Vaccine (8 - 2025-26 season) 07/22/2024   Medicare Annual Wellness (AWV)  09/14/2024   Mammogram  02/11/2025   Colonoscopy  08/06/2025   DTaP/Tdap/Td (2 - Td or Tdap) 12/31/2031   Pneumococcal Vaccine: 50+ Years  Completed   Influenza Vaccine  Completed   DEXA SCAN  Completed   Hepatitis C Screening  Completed   Zoster Vaccines- Shingrix  Completed   Meningococcal B Vaccine  Aged Out    Health Maintenance Items Addressed: Vaccines Due: covid  Additional Screening:  Vision Screening: Recommended annual ophthalmology exams for early detection of glaucoma and other disorders of the eye. Is the patient up to date with their annual eye exam?  Yes  Who is the provider or what is the name of the office in which the patient attends annual eye exams? [facility]  Dental Screening: Recommended annual dental exams for  proper oral hygiene  Community Resource Referral / Chronic Care Management: CRR required this visit?  No   CCM required this visit?  No   Plan:    I have personally reviewed and noted the following in the patient's chart:   Medical and social history Use of alcohol, tobacco or illicit drugs  Current medications and supplements including opioid prescriptions. Patient is not currently taking opioid prescriptions. Functional ability and status Nutritional status Physical activity Advanced directives List of other physicians Hospitalizations, surgeries, and ER visits in previous 12 months Vitals Screenings to include cognitive, depression, and falls Referrals and appointments  In addition, I have reviewed and discussed with patient certain preventive protocols, quality metrics, and best practice recommendations. A written personalized care plan for preventive services as well as general preventive health recommendations were provided to patient.   Ardella FORBES Dawn, LPN   [id number]   After Visit Summary: (MyChart) Due to this being a telephonic visit, the after visit summary with patients personalized plan was offered to patient via MyChart   Notes: Nothing significant to report at this time.

## 2024-09-23 NOTE — Patient Instructions (Signed)
 Crystal Blair,  Thank you for taking the time for your Medicare Wellness Visit. I appreciate your continued commitment to your health goals. Please review the care plan we discussed, and feel free to reach out if I can assist you further.  Please note that Annual Wellness Visits do not include a physical exam. Some assessments may be limited, especially if the visit was conducted virtually. If needed, we may recommend an in-person follow-up with your provider.  Ongoing Care Seeing your primary care provider every 3 to 6 months helps us  monitor your health and provide consistent, personalized care.   Referrals If a referral was made during today's visit and you haven't received any updates within two weeks, please contact the referred provider directly to check on the status.  Recommended Screenings:  Health Maintenance  Topic Date Due   COVID-19 Vaccine (8 - 2025-26 season) 07/22/2024   Breast Cancer Screening  02/11/2025   Colon Cancer Screening  08/06/2025   Medicare Annual Wellness Visit  09/23/2025   DTaP/Tdap/Td vaccine (2 - Td or Tdap) 12/31/2031   Pneumococcal Vaccine for age over 69  Completed   Flu Shot  Completed   DEXA scan (bone density measurement)  Completed   Hepatitis C Screening  Completed   Zoster (Shingles) Vaccine  Completed   Meningitis B Vaccine  Aged Out       09/23/2024    8:11 AM  Advanced Directives  Does Patient Have a Medical Advance Directive? Yes  Type of Estate Agent of [place];Living will  Copy of Healthcare Power of Attorney in Chart? No - copy requested    Vision: Annual vision screenings are recommended for early detection of glaucoma, cataracts, and diabetic retinopathy. These exams can also reveal signs of chronic conditions such as diabetes and high blood pressure.  Dental: Annual dental screenings help detect early signs of oral cancer, gum disease, and other conditions linked to overall health, including heart disease  and diabetes.  Please see the attached documents for additional preventive care recommendations.

## 2024-11-25 ENCOUNTER — Ambulatory Visit: Payer: Self-pay

## 2024-11-25 NOTE — Telephone Encounter (Signed)
 FYI Only or Action Required?: FYI only for provider: appointment scheduled on tomorrow afternoon.  Patient was last seen in primary care on 07/24/2024 by Nche, Roselie Rockford, NP.  Called Nurse Triage reporting Dysuria. Also had pink urine and possible blood in another urination.  Symptoms began several days ago.  Interventions attempted: Ice/heat application. Heated car seat.  Symptoms are: unchanged.  Triage Disposition: See Physician Within 24 Hours  Patient/caregiver understands and will follow disposition?: Yes                   Copied from CRM [id number]. Topic: Clinical - Red Word Triage >> Nov 25, 2024  5:20 PM Alfonso HERO wrote: Patient has a possible UTI but the pain is around her bladder, burning when she pee's and light pink discharge. Family has a history of bladder cancer and she is concerned Reason for Disposition  Age > 50 years  Answer Assessment - Initial Assessment Questions 1. SEVERITY: How bad is the pain?  (e.g., Scale 1-10; mild, moderate, or severe)     Very bad in the morning - gets better as day goes on 2. FREQUENCY: How many times have you had painful urination today?      Each  3. PATTERN: Is pain present every time you urinate or just sometimes?      Each time 4. ONSET: When did the painful urination start?      Friday night 5. FEVER: Do you have a fever? If Yes, ask: What is your temperature, how was it measured, and when did it start?     unsure 6. PAST UTI: Have you had a urine infection before? If Yes, ask: When was the last time? and What happened that time?      Yes.  7. CAUSE: What do you think is causing the painful urination?  (e.g., UTI, scratch, Herpes sore)     UTI family hx of bladder CA 8. OTHER SYMPTOMS: Do you have any other symptoms? (e.g., blood in urine, flank pain, genital sores, urgency, vaginal discharge)     A little itching, pink urine Friday and again one other time 9. PREGNANCY: Is there any  chance you are pregnant? When was your last menstrual period?     *No Answer*  Protocols used: Urination Pain - Female-A-AH

## 2024-11-26 ENCOUNTER — Ambulatory Visit: Admitting: Family Medicine

## 2024-11-26 ENCOUNTER — Encounter: Payer: Self-pay | Admitting: Family Medicine

## 2024-11-26 VITALS — BP 114/66 | HR 88 | Ht 64.0 in | Wt 141.8 lb

## 2024-11-26 DIAGNOSIS — I700 Atherosclerosis of aorta: Secondary | ICD-10-CM | POA: Diagnosis not present

## 2024-11-26 DIAGNOSIS — R300 Dysuria: Secondary | ICD-10-CM | POA: Diagnosis not present

## 2024-11-26 DIAGNOSIS — Z8052 Family history of malignant neoplasm of bladder: Secondary | ICD-10-CM

## 2024-11-26 DIAGNOSIS — N958 Other specified menopausal and perimenopausal disorders: Secondary | ICD-10-CM

## 2024-11-26 DIAGNOSIS — Z87442 Personal history of urinary calculi: Secondary | ICD-10-CM | POA: Diagnosis not present

## 2024-11-26 DIAGNOSIS — E7800 Pure hypercholesterolemia, unspecified: Secondary | ICD-10-CM

## 2024-11-26 LAB — POC URINALSYSI DIPSTICK (AUTOMATED)
Bilirubin, UA: NEGATIVE
Glucose, UA: NEGATIVE
Ketones, UA: NEGATIVE
Leukocytes, UA: NEGATIVE
Nitrite, UA: NEGATIVE
Protein, UA: NEGATIVE
Spec Grav, UA: 1.01
Urobilinogen, UA: 0.2 U/dL
pH, UA: 7

## 2024-11-26 NOTE — Telephone Encounter (Signed)
 Noted. Dm/cma

## 2024-12-01 ENCOUNTER — Encounter: Payer: Self-pay | Admitting: Family Medicine

## 2024-12-01 DIAGNOSIS — N958 Other specified menopausal and perimenopausal disorders: Secondary | ICD-10-CM | POA: Insufficient documentation

## 2024-12-01 DIAGNOSIS — Z87442 Personal history of urinary calculi: Secondary | ICD-10-CM | POA: Insufficient documentation

## 2024-12-01 MED ORDER — ESTROGENS CONJUGATED 0.625 MG/GM VA CREA: 1.0000 | TOPICAL_CREAM | VAGINAL | 12 refills | Status: AC

## 2024-12-01 NOTE — Progress Notes (Signed)
 "    Diagnoses and Orders:   1. Dysuria   2. Genitourinary syndrome of menopause   3. Aortic atherosclerosis   4. Pure hypercholesterolemia   5. History of nephrolithiasis   6. Family history of bladder cancer, sister, Dx age 72    Orders Placed This Encounter  Procedures   CT CARDIAC SCORING (SELF PAY ONLY)   POCT Urinalysis Dipstick (Automated)   Assessment & Plan:   Assessment and Plan Assessment & Plan Nephrolithiasis with hematuria and dysuria Intermittent hematuria and dysuria with history of nephrolithiasis. Urinalysis shows trace blood, negative leukocytes and nitrates. Symptoms suggest possible kidney stone passage. Postmenopausal status increases risk for complications. - Consider Flomax for potential kidney stone passage. - Provided strainer for urine to catch stones. - Advised hydration. - Discussed CT scan if symptoms recur.  Postmenopausal atrophic vaginitis Increased risk of UTIs due to postmenopausal atrophic vaginitis. No current use of vaginal estrogen. - Prescribed vaginal estrogen cream.  Osteoporosis Recent bone density scan shows slight increase in bone loss. Hesitant about osteoporosis medications due to potential side effects. Discussed alternative approaches including vitamin D , calcium , and strength training. - Encouraged vitamin D  and calcium  supplementation. - Recommended strength training exercises.  Ulcerative colitis Recent crampy abdominal pain and constipation. Currently on mesalamine  (Apriso ). - Continue mesalamine  (Apriso ).  Aortic atherosclerosis Managed with statin therapy. Concerns about statin side effects including muscle pain and elevated CK levels. Discussed alternative lipid-lowering therapies and potential cardiac CT scan to assess coronary artery plaque. - Ordered cardiac CT scan to assess coronary artery plaque. - Will discuss alternative lipid-lowering therapies if statin side effects persist.  Hypercholesterolemia Managed  with statin therapy. Concerns about statin side effects including muscle pain and elevated CK levels. Discussed alternative lipid-lowering therapies and potential cardiac CT scan to assess coronary artery plaque. - Ordered cardiac CT scan to assess coronary artery plaque. - Will discuss alternative lipid-lowering therapies if statin side effects persist.  Geni Shutter, DO, MS, FAAFP, Dipl. [facility] [address] [place], [address] Dept: [phone] Dept Fax: [phone]  Subjective:   History of Present Illness Crystal Blair is a 74 year old female who presents with pink urine and possible urinary tract infection (UTI).  Hematuria and urethral pain - Pink urine noted on Friday following two consecutive episodes of sexual intercourse - Severe urethral pain described as stabbing, similar to being stabbed with an ice pick - History of cystitis after sexual activity - No visible blood in urine since Friday, but recent urinalysis showed trace blood - No use of medications for current episode; previously used Pyridium for similar symptoms - Increased water intake since onset of symptoms  Back pain - Severe back pain this morning, perceived as muscular but concerned about possible kidney stone due to intensity - History of similar severe pain associated with prior intercourse-related discomfort  Ulcerative colitis - Ulcerative colitis with cramping and occasional constipation - Currently taking mesalamine  (Apriso ), now in generic form  Aortic sclerosis and statin use - Taking statin for aortic sclerosis - Dislikes statin due to muscle aches and prior episode of elevated creatine kinase requiring temporary discontinuation  Osteopenia - Diagnosed with osteopenia - Not taking osteoporosis medication - Uses vitamin D , calcium  supplementation, and strength training  Lifestyle factors - Limited water intake - High coffee  consumption  Family history of bladder cancer - Sister diagnosed with bladder cancer at age 43 - Sister was a heavy smoker - No personal  history of smoking  Review of Systems: Negative, with the exception of above mentioned in HPI.  History:   Reviewed by clinician on day of visit: allergies, medications, problem list, medical history, surgical history, family history, social history, and previous encounter notes.  Medications:   Show/hide medication list[1] Allergies[2]  Objective:   BP 114/66 (BP Location: Right Arm, Cuff Size: Normal)   Pulse 88   Ht 5' 4 (1.626 m)   Wt 141 lb 12.8 oz (64.3 kg)   SpO2 97%   BMI 24.34 kg/m   Physical Exam Constitutional:      General: She is not in acute distress.    Appearance: She is well-developed.  HENT:     Head: Normocephalic and atraumatic.  Eyes:     Conjunctiva/sclera: Conjunctivae normal.  Cardiovascular:     Rate and Rhythm: Normal rate and regular rhythm.     Heart sounds: Normal heart sounds.  Pulmonary:     Effort: Pulmonary effort is normal.     Breath sounds: Normal breath sounds.  Neurological:     General: No focal deficit present.     Mental Status: She is alert.  Psychiatric:        Behavior: Behavior normal.    Results for orders placed or performed in visit on 11/26/24  POCT Urinalysis Dipstick (Automated)   Collection Time: 11/26/24  3:46 PM  Result Value Ref Range   Color, UA yellow    Clarity, UA clear    Glucose, UA Negative Negative   Bilirubin, UA Negative    Ketones, UA Negative    Spec Grav, UA 1.010 1.010 - 1.025   Blood, UA trace    pH, UA 7.0 5.0 - 8.0   Protein, UA Negative Negative   Urobilinogen, UA 0.2 0.2 or 1.0 E.U./dL   Nitrite, UA Negative    Leukocytes, UA Negative Negative    Attestations:   Reviewed by clinician on day of visit: allergies, medications, problem list, medical history, surgical history, family history, social history, and previous encounter notes.  The  patient is being seen today for an acute visit.  Geni Shutter, DO, MS, FAAFP, Dipl. [facility] [address] [place], [address] Dept: [phone] Dept Fax: [phone]    [1]  Outpatient Medications Prior to Visit  Medication Sig   azelastine  (ASTELIN ) 0.1 % nasal spray Place 1 spray into both nostrils 2 (two) times daily. Use in each nostril as directed   Bioflavonoid Products (VITAMIN C) CHEW Chew 1 tablet by mouth daily in the afternoon.   Calcium  Carb-Cholecalciferol (CALTRATE 600+D3 PO) Take 1 tablet by mouth daily.   Coenzyme Q10 (CO Q 10 PO) Take 1 capsule by mouth daily.   dicyclomine  (BENTYL ) 20 MG tablet Take 1 tablet (20 mg total) by mouth 3 (three) times daily as needed for spasms.   fluticasone  (FLONASE ) 50 MCG/ACT nasal spray Place 2 sprays into both nostrils daily. (Patient taking differently: Place 2 sprays into both nostrils daily as needed.)   hyoscyamine  (ANASPAZ ) 0.125 MG TBDP disintergrating tablet Place 1 tablet (0.125 mg total) under the tongue every 4 (four) hours as needed for cramping.   mesalamine  (APRISO ) 0.375 g 24 hr capsule Take 4 capsules (1.5 g total) by mouth daily.   Multiple Vitamin (MULTIVITAMIN) tablet Take 1 tablet by mouth daily.   rosuvastatin  (CRESTOR ) 20 MG tablet 20mg  2x/week x 2months, then 3x/week continuously.   No facility-administered medications prior to visit.  [2]  Allergies Allergen Reactions   Penicillin G Other (See Comments)    As a kid had a reaction  Other reaction(s): Not available, Other (See Comments)  As a kid had a reaction   Mango Flavoring Agent (Non-Screening)    "

## 2024-12-03 MED ORDER — MESALAMINE 1.2 G PO TBEC: 2.4000 g | DELAYED_RELEASE_TABLET | Freq: Every day | ORAL | 3 refills | Status: AC

## 2024-12-17 ENCOUNTER — Encounter: Payer: Self-pay | Admitting: Nurse Practitioner

## 2024-12-18 ENCOUNTER — Ambulatory Visit: Payer: Self-pay | Admitting: Nurse Practitioner

## 2024-12-18 ENCOUNTER — Ambulatory Visit: Admitting: Nurse Practitioner

## 2024-12-18 ENCOUNTER — Encounter: Payer: Self-pay | Admitting: Nurse Practitioner

## 2024-12-18 ENCOUNTER — Other Ambulatory Visit (HOSPITAL_COMMUNITY)
Admission: RE | Admit: 2024-12-18 | Discharge: 2024-12-18 | Disposition: A | Discharge status: Home / Self Care | Source: Ambulatory Visit | Attending: Nurse Practitioner | Admitting: Nurse Practitioner

## 2024-12-18 VITALS — BP 118/62 | HR 88 | Temp 98.2°F | Ht 64.0 in | Wt 145.2 lb

## 2024-12-18 DIAGNOSIS — N898 Other specified noninflammatory disorders of vagina: Secondary | ICD-10-CM | POA: Insufficient documentation

## 2024-12-18 DIAGNOSIS — N3000 Acute cystitis without hematuria: Secondary | ICD-10-CM

## 2024-12-18 DIAGNOSIS — R300 Dysuria: Secondary | ICD-10-CM | POA: Diagnosis not present

## 2024-12-18 DIAGNOSIS — N941 Unspecified dyspareunia: Secondary | ICD-10-CM | POA: Insufficient documentation

## 2024-12-18 LAB — POCT URINALYSIS DIPSTICK
Bilirubin, UA: NEGATIVE
Blood, UA: NEGATIVE
Glucose, UA: NEGATIVE
Ketones, UA: NEGATIVE
Leukocytes, UA: NEGATIVE
Nitrite, UA: NEGATIVE
Protein, UA: NEGATIVE
Spec Grav, UA: 1.015
Urobilinogen, UA: NEGATIVE U/dL — AB
pH, UA: 6

## 2024-12-18 NOTE — Patient Instructions (Addendum)
 Start estrogen cream as prescribed

## 2024-12-18 NOTE — Progress Notes (Signed)
 "  Acute Office Visit  Subjective:    Patient ID: Crystal Blair, female    DOB: Jun 21, 1951, 74 y.o.   MRN: [id number]  Chief Complaint  Patient presents with   Urinary Tract Infection    Pain and discomfort when urinating, itching and burning sensation worse yesterday   HPI Dysuria Recurrent, associated with urinary frequency, worse after intercourse despite use of water based lubricant. Normal UA today, normal pelvic exam today Estrogen cream prescribed by my colleague in the past, but she did not use. Last PAP 2020: normal.  Sent urine for culture and wet prep Encouraged to start estrogen cream.   Show/hide medication list[1]  Reviewed past medical and social history.  Review of Systems Per HPI     Objective:    Physical Exam Vitals reviewed. Exam conducted with a chaperone present.  Constitutional:      General: She is not in acute distress. Pulmonary:     Effort: Pulmonary effort is normal.  Abdominal:     Palpations: Abdomen is soft.     Tenderness: There is no abdominal tenderness. There is no right CVA tenderness, left CVA tenderness or guarding.  Genitourinary:    General: Normal vulva.     Vagina: No erythema, tenderness or bleeding.     Cervix: Normal.     Uterus: Normal.      Adnexa: Right adnexa normal and left adnexa normal.     Comments: Atrophy vaginal walls Skin:    Findings: No rash.  Neurological:     Mental Status: She is alert and oriented to person, place, and time.    BP 118/62 (BP Location: Left Arm, Patient Position: Sitting, Cuff Size: Normal)   Pulse 88   Temp 98.2 F (36.8 C) (Oral)   Ht 5' 4 (1.626 m)   Wt 145 lb 3.2 oz (65.9 kg)   SpO2 96%   BMI 24.92 kg/m    Results for orders placed or performed in visit on 12/18/24  POCT urinalysis dipstick  Result Value Ref Range   Color, UA Yellow    Clarity, UA Clear    Glucose, UA Negative Negative   Bilirubin, UA Negative    Ketones, UA Negative    Spec Grav, UA 1.015  1.010 - 1.025   Blood, UA Negative    pH, UA 6.0 5.0 - 8.0   Protein, UA Negative Negative   Urobilinogen, UA negative (A) 0.2 or 1.0 E.U./dL   Nitrite, UA Negative    Leukocytes, UA Negative Negative   Appearance Clear    Odor No         Assessment & Plan:   Problem List Items Addressed This Visit     Dyspareunia, female   Relevant Orders   Cervicovaginal ancillary only( [place])   Dysuria - Primary   Recurrent, associated with urinary frequency, worse after intercourse despite use of water based lubricant. Normal UA today, normal pelvic exam today Estrogen cream prescribed by my colleague in the past, but she did not use. Last PAP 2020: normal.  Sent urine for culture and wet prep Encouraged to start estrogen cream.      Relevant Orders   Urine Culture   POCT urinalysis dipstick (Completed)   Other Visit Diagnoses       Vaginal itching       Relevant Orders   Cervicovaginal ancillary only( [place])      No orders of the defined types were placed in this encounter.  Return if  symptoms worsen or fail to improve.    Roselie Mood, NP     [1]  Outpatient Medications Prior to Visit  Medication Sig   azelastine  (ASTELIN ) 0.1 % nasal spray Place 1 spray into both nostrils 2 (two) times daily. Use in each nostril as directed   Bioflavonoid Products (VITAMIN C) CHEW Chew 1 tablet by mouth daily in the afternoon.   Calcium  Carb-Cholecalciferol (CALTRATE 600+D3 PO) Take 1 tablet by mouth daily.   Coenzyme Q10 (CO Q 10 PO) Take 1 capsule by mouth daily.   conjugated estrogens  (PREMARIN ) vaginal cream Place 1 Applicatorful vaginally every other day.   dicyclomine  (BENTYL ) 20 MG tablet Take 1 tablet (20 mg total) by mouth 3 (three) times daily as needed for spasms.   fluticasone  (FLONASE ) 50 MCG/ACT nasal spray Place 2 sprays into both nostrils daily. (Patient taking differently: Place 2 sprays into both nostrils daily as needed.)   hyoscyamine  (ANASPAZ )  0.125 MG TBDP disintergrating tablet Place 1 tablet (0.125 mg total) under the tongue every 4 (four) hours as needed for cramping.   mesalamine  (LIALDA ) 1.2 g EC tablet Take 2 tablets (2.4 g total) by mouth daily with breakfast.   Multiple Vitamin (MULTIVITAMIN) tablet Take 1 tablet by mouth daily.   rosuvastatin  (CRESTOR ) 20 MG tablet 20mg  2x/week x 2months, then 3x/week continuously.   No facility-administered medications prior to visit.   "

## 2024-12-18 NOTE — Telephone Encounter (Signed)
 Patient called and scheduled for an appointment for today

## 2024-12-18 NOTE — Assessment & Plan Note (Signed)
 Recurrent, associated with urinary frequency, worse after intercourse despite use of water based lubricant. Normal UA today, normal pelvic exam today Estrogen cream prescribed by my colleague in the past, but she did not use. Last PAP 2020: normal.  Sent urine for culture and wet prep Encouraged to start estrogen cream.

## 2024-12-19 LAB — CERVICOVAGINAL ANCILLARY ONLY
Bacterial Vaginitis (gardnerella): NEGATIVE
Candida Glabrata: NEGATIVE
Candida Vaginitis: NEGATIVE
Chlamydia: NEGATIVE
Comment: NEGATIVE
Comment: NEGATIVE
Comment: NEGATIVE
Comment: NEGATIVE
Comment: NEGATIVE
Comment: NORMAL
Neisseria Gonorrhea: NEGATIVE
Trichomonas: NEGATIVE

## 2024-12-20 LAB — URINE CULTURE
MICRO NUMBER:: 17521541
SPECIMEN QUALITY:: ADEQUATE

## 2024-12-20 MED ORDER — SULFAMETHOXAZOLE-TRIMETHOPRIM 800-160 MG PO TABS: 1.0000 | ORAL_TABLET | Freq: Two times a day (BID) | ORAL | 0 refills | Status: AC

## 2025-01-21 ENCOUNTER — Ambulatory Visit: Admitting: Nurse Practitioner

## 2025-02-17 ENCOUNTER — Ambulatory Visit

## 2025-09-29 ENCOUNTER — Ambulatory Visit
